# Patient Record
Sex: Female | Born: 1965 | Race: Black or African American | Hispanic: No | Marital: Single | Smoking: Never smoker
Health system: Southern US, Community
[De-identification: ages and names within clinical notes are randomized; demographics above are authoritative.]

## PROBLEM LIST (undated history)

## (undated) DIAGNOSIS — F419 Anxiety disorder, unspecified: Secondary | ICD-10-CM

## (undated) DIAGNOSIS — Z8719 Personal history of other diseases of the digestive system: Secondary | ICD-10-CM

## (undated) DIAGNOSIS — G4700 Insomnia, unspecified: Secondary | ICD-10-CM

## (undated) DIAGNOSIS — K219 Gastro-esophageal reflux disease without esophagitis: Secondary | ICD-10-CM

## (undated) DIAGNOSIS — Z973 Presence of spectacles and contact lenses: Secondary | ICD-10-CM

## (undated) DIAGNOSIS — J189 Pneumonia, unspecified organism: Secondary | ICD-10-CM

## (undated) DIAGNOSIS — D649 Anemia, unspecified: Secondary | ICD-10-CM

## (undated) DIAGNOSIS — R519 Headache, unspecified: Secondary | ICD-10-CM

## (undated) DIAGNOSIS — R011 Cardiac murmur, unspecified: Secondary | ICD-10-CM

## (undated) DIAGNOSIS — M1990 Unspecified osteoarthritis, unspecified site: Secondary | ICD-10-CM

## (undated) DIAGNOSIS — I10 Essential (primary) hypertension: Secondary | ICD-10-CM

## (undated) HISTORY — PX: ROUX-EN-Y GASTRIC BYPASS: SHX1104

## (undated) HISTORY — PX: APPENDECTOMY: SHX54

## (undated) HISTORY — PX: BUNIONECTOMY: SHX129

## (undated) HISTORY — PX: OTHER SURGICAL HISTORY: SHX169

## (undated) HISTORY — PX: FOOT SURGERY: SHX648

## (undated) HISTORY — PX: CHOLECYSTECTOMY: SHX55

## (undated) HISTORY — PX: HERNIA REPAIR: SHX51

---

## 2011-04-05 ENCOUNTER — Ambulatory Visit: Payer: Self-pay | Admitting: Family Medicine

## 2012-05-31 IMAGING — CR CERVICAL SPINE - COMPLETE 4+ VIEW
1 series · 8 of 8 positions shown · non-contrast
Comparison: none

REASON FOR EXAM: Dx Cervical neck strain Please Fax report [phone]
COMMENTS:

PROCEDURE:     DXR - DXR CERVICAL SPINE COMPLETE  - April 05, 2011 [DATE]
RESULT:     No acute soft tissue or bony abnormality is identified.
Degenerative changes are noted of the lower cervical spine.

[Series 1: w cervical spine lat · 0.14mm/px · 8 of 8 slices shown]
[im 1/8]
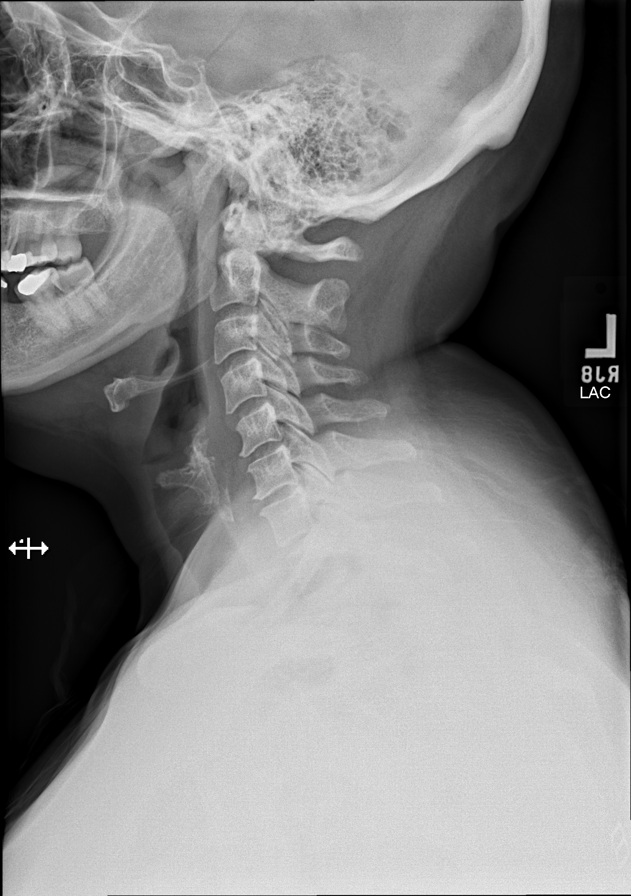
[im 2/8]
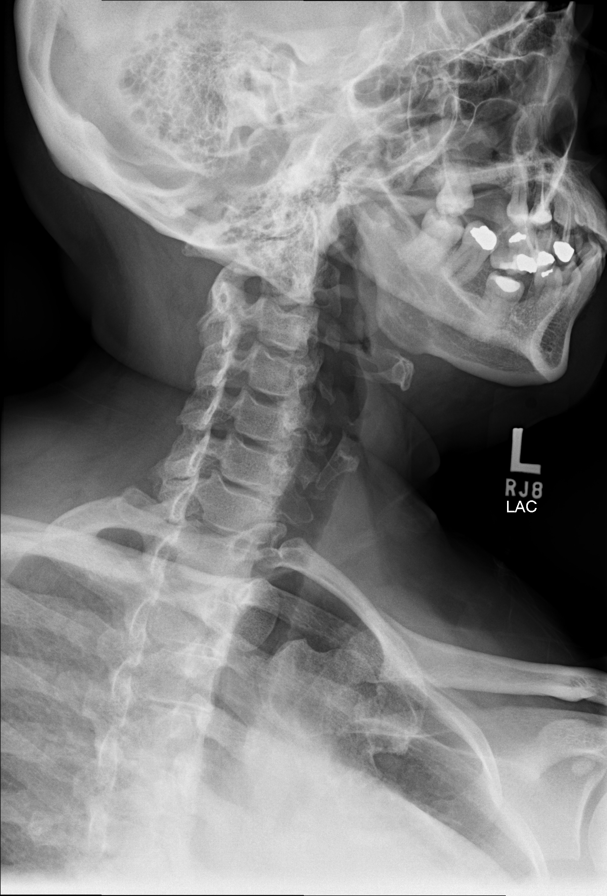
[im 3/8]
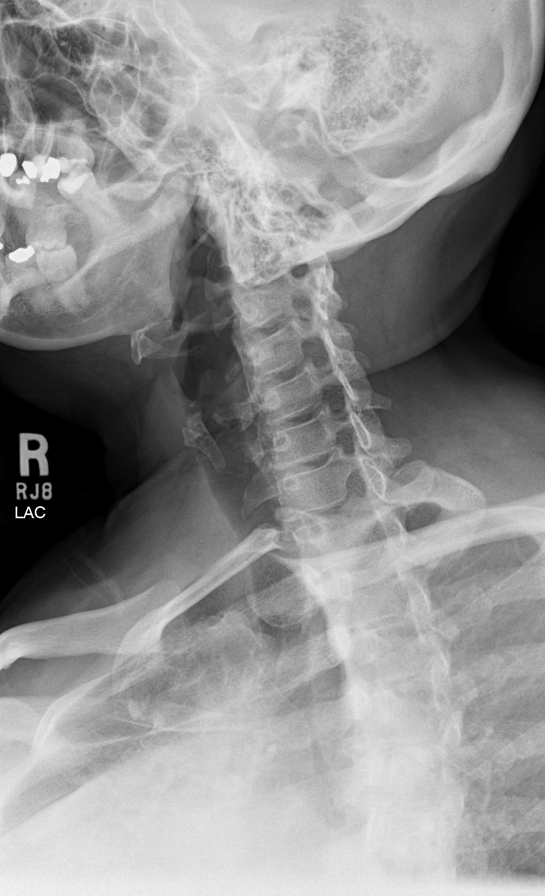
[im 4/8]
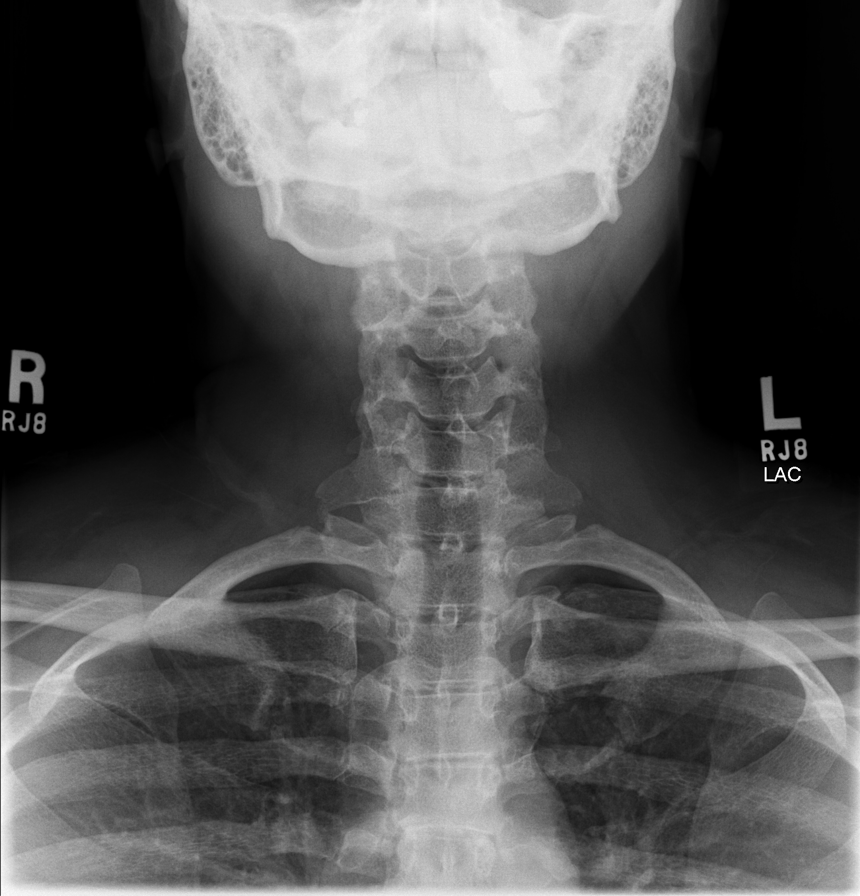
[im 5/8]
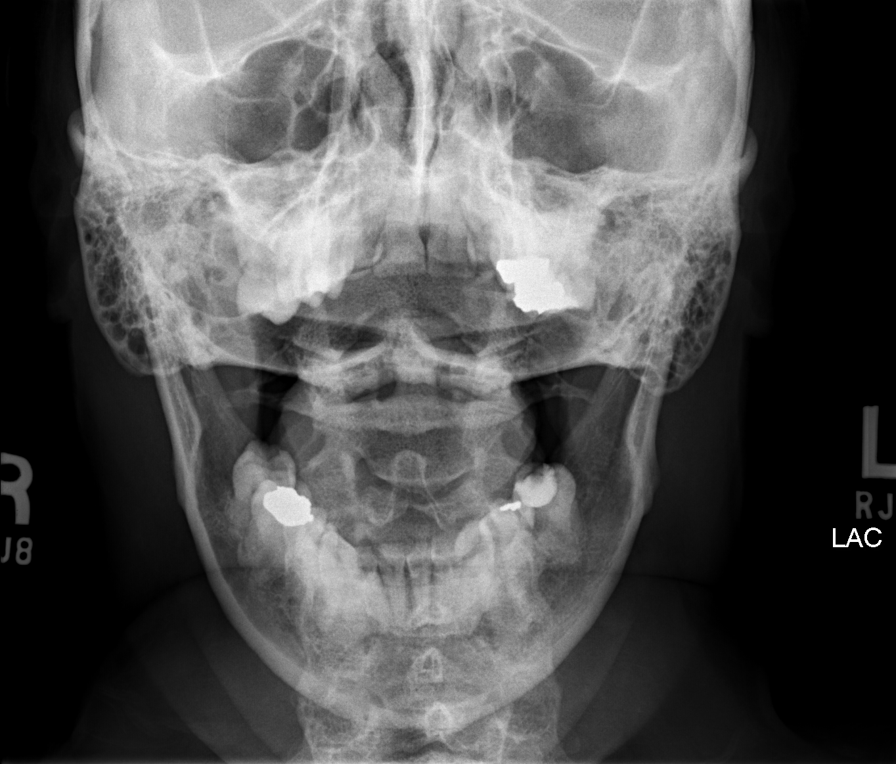
[im 6/8]
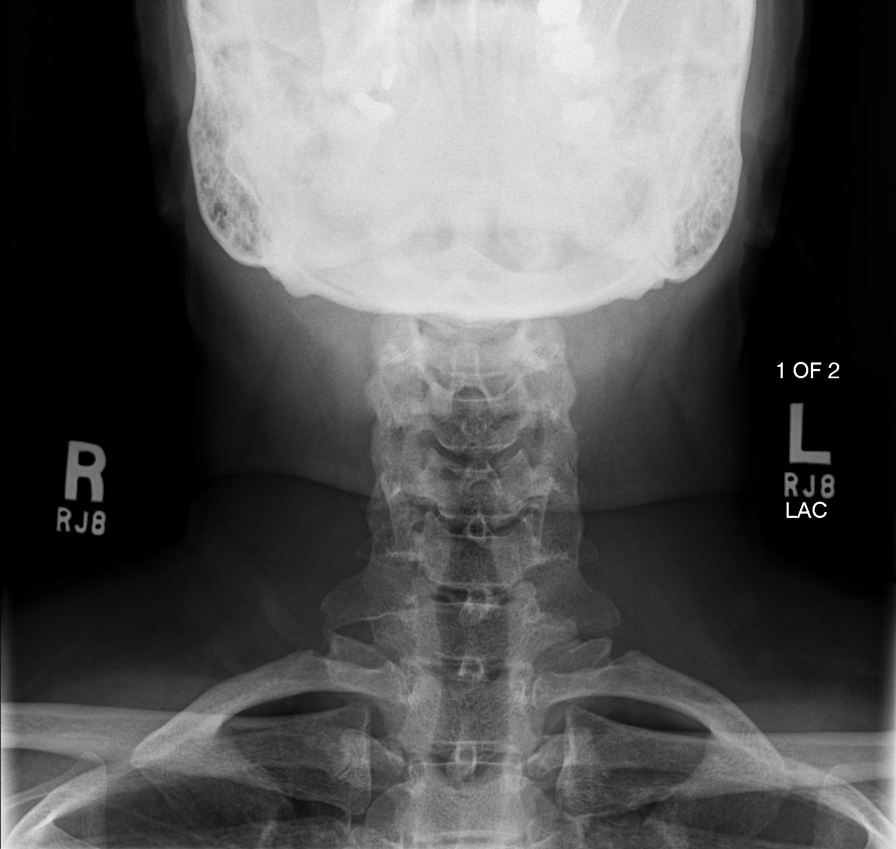
[im 7/8]
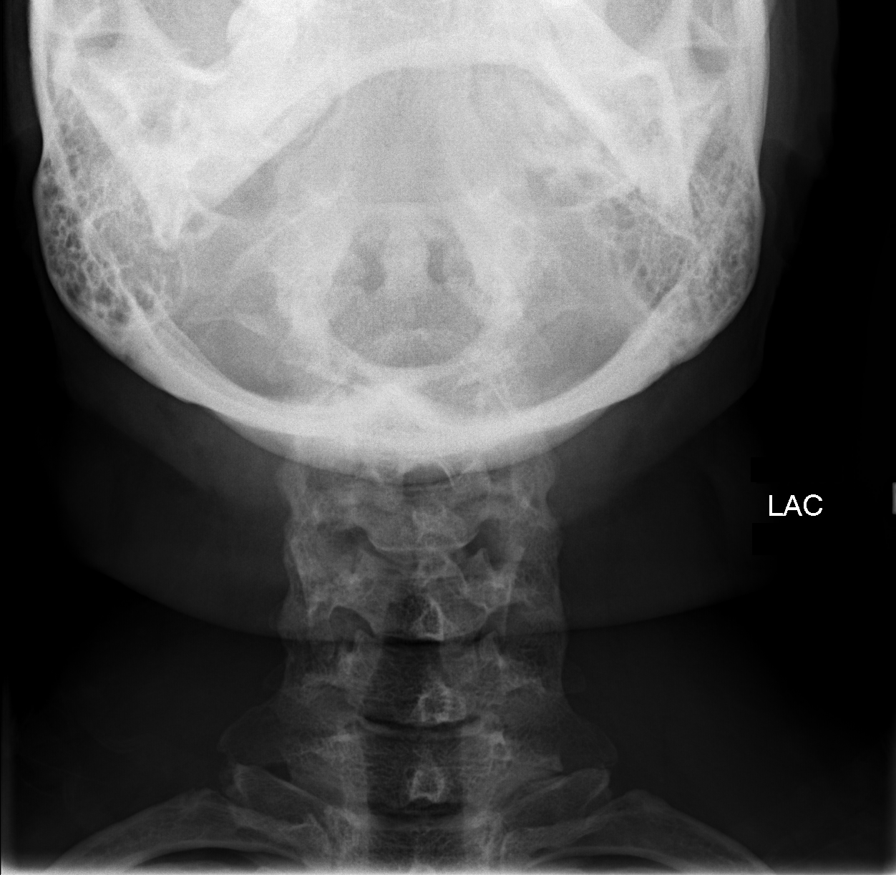
[im 8/8]
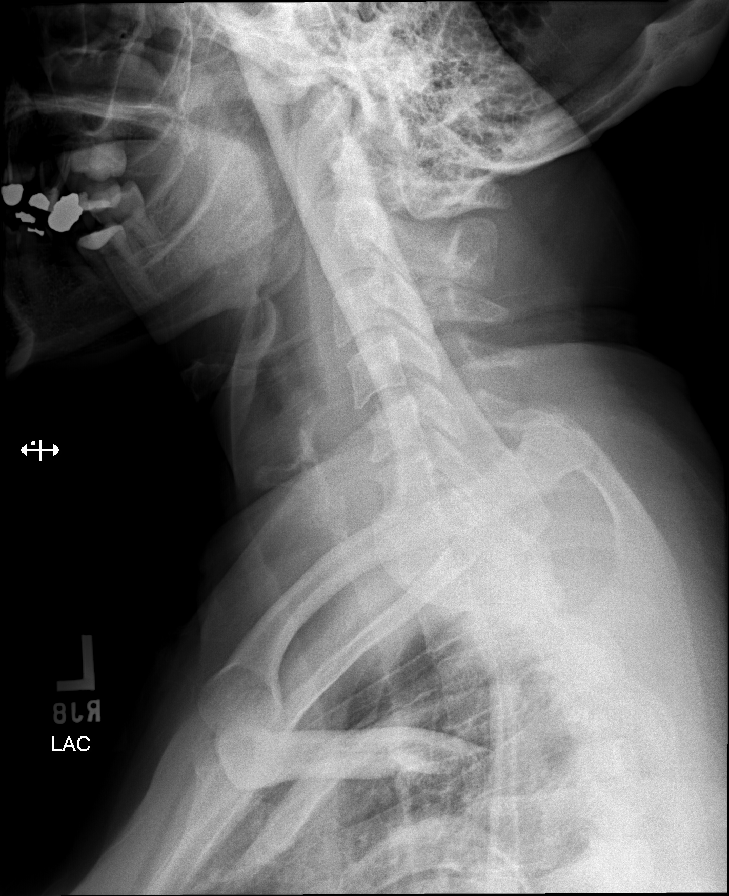

[8 of 8 positions shown; findings below may reference images not displayed]

IMPRESSION: No acute abnormality.

## 2015-02-01 DIAGNOSIS — Z8719 Personal history of other diseases of the digestive system: Secondary | ICD-10-CM | POA: Insufficient documentation

## 2015-02-01 DIAGNOSIS — I10 Essential (primary) hypertension: Secondary | ICD-10-CM | POA: Insufficient documentation

## 2015-02-01 DIAGNOSIS — E669 Obesity, unspecified: Secondary | ICD-10-CM | POA: Insufficient documentation

## 2015-12-27 DIAGNOSIS — F32A Depression, unspecified: Secondary | ICD-10-CM | POA: Insufficient documentation

## 2015-12-27 DIAGNOSIS — Z8639 Personal history of other endocrine, nutritional and metabolic disease: Secondary | ICD-10-CM | POA: Insufficient documentation

## 2018-07-09 ENCOUNTER — Ambulatory Visit (INDEPENDENT_AMBULATORY_CARE_PROVIDER_SITE_OTHER): Payer: No Typology Code available for payment source

## 2018-07-09 ENCOUNTER — Other Ambulatory Visit: Payer: Self-pay

## 2018-07-09 ENCOUNTER — Encounter: Payer: Self-pay | Admitting: Podiatry

## 2018-07-09 ENCOUNTER — Ambulatory Visit (INDEPENDENT_AMBULATORY_CARE_PROVIDER_SITE_OTHER): Payer: No Typology Code available for payment source | Admitting: Podiatry

## 2018-07-09 VITALS — BP 153/84 | HR 89

## 2018-07-09 DIAGNOSIS — M2011 Hallux valgus (acquired), right foot: Secondary | ICD-10-CM | POA: Diagnosis not present

## 2018-07-09 DIAGNOSIS — M2012 Hallux valgus (acquired), left foot: Secondary | ICD-10-CM

## 2018-07-09 DIAGNOSIS — M2042 Other hammer toe(s) (acquired), left foot: Secondary | ICD-10-CM

## 2018-07-09 DIAGNOSIS — M2010 Hallux valgus (acquired), unspecified foot: Secondary | ICD-10-CM

## 2018-07-09 DIAGNOSIS — M21611 Bunion of right foot: Secondary | ICD-10-CM

## 2018-07-09 DIAGNOSIS — M205X2 Other deformities of toe(s) (acquired), left foot: Secondary | ICD-10-CM

## 2018-07-09 NOTE — Patient Instructions (Signed)
Pre-Operative Instructions  Congratulations, you have decided to take an important step towards improving your quality of life.  You can be assured that the doctors and staff at [facility] will be with you every step of the way.  Here are some important things you should know:  1. Plan to be at the surgery center/hospital at least 1 (one) hour prior to your scheduled time, unless otherwise directed by the surgical center/hospital staff.  You must have a responsible adult accompany you, remain during the surgery and drive you home.  Make sure you have directions to the surgical center/hospital to ensure you arrive on time. 2. If you are having surgery at [facility] or [facility], you will need a copy of your medical history and physical form from your family physician within one month prior to the date of surgery. We will give you a form for your primary physician to complete.  3. We make every effort to accommodate the date you request for surgery.  However, there are times where surgery dates or times have to be moved.  We will contact you as soon as possible if a change in schedule is required.   4. No aspirin/ibuprofen for one week before surgery.  If you are on aspirin, any non-steroidal anti-inflammatory medications (Mobic, Aleve, Ibuprofen) should not be taken seven (7) days prior to your surgery.  You make take Tylenol for pain prior to surgery.  5. Medications - If you are taking daily heart and blood pressure medications, seizure, reflux, allergy, asthma, anxiety, pain or diabetes medications, make sure you notify the surgery center/hospital before the day of surgery so they can tell you which medications you should take or avoid the day of surgery. 6. No food or drink after midnight the night before surgery unless directed otherwise by surgical center/hospital staff. 7. No alcoholic beverages 24-hours prior to surgery.  No smoking 24-hours prior or 24-hours after  surgery. 8. Wear loose pants or shorts. They should be loose enough to fit over bandages, boots, and casts. 9. Don't wear slip-on shoes. Sneakers are preferred. 10. Bring your boot with you to the surgery center/hospital.  Also bring crutches or a walker if your physician has prescribed it for you.  If you do not have this equipment, it will be provided for you after surgery. 11. If you have not been contacted by the surgery center/hospital by the day before your surgery, call to confirm the date and time of your surgery. 12. Leave-time from work may vary depending on the type of surgery you have.  Appropriate arrangements should be made prior to surgery with your employer. 13. Prescriptions will be provided immediately following surgery by your doctor.  Fill these as soon as possible after surgery and take the medication as directed. Pain medications will not be refilled on weekends and must be approved by the doctor. 14. Remove nail polish on the operative foot and avoid getting pedicures prior to surgery. 15. Wash the night before surgery.  The night before surgery wash the foot and leg well with water and the antibacterial soap provided. Be sure to pay special attention to beneath the toenails and in between the toes.  Wash for at least three (3) minutes. Rinse thoroughly with water and dry well with a towel.  Perform this wash unless told not to do so by your physician.  Enclosed: 1 Ice pack (please put in freezer the night before surgery)   1 Hibiclens skin cleaner     Pre-op instructions  If you have any questions regarding the instructions, please do not hesitate to call our office.  [place]: [address], [place], [place] [address] -- [phone]  [place]: [address] [place], [place] [address] -- [phone]  [place]: [address]  [place], [place] [address] -- [phone]  [place]: [address], [address], [place], [place] [address] -- [phone]  Website:  [email or web address] 

## 2018-07-09 NOTE — Progress Notes (Signed)
   HPI: 52-year-old female presents the office today as a new patient in regards to hammertoes to the second third digits of the left foot.  Patient had surgery in November 2019 on her left foot including bunionectomy with hammertoe repair 2, 3.  [facility]  She then had surgery in December 2019 on her right foot which only included bunionectomy. Patient developed complications for her left foot and she became frustrated.  She requested a second opinion she was referred here for a second opinion and treatment options.  History reviewed. No pertinent past medical history.   Physical Exam: General: The patient is alert and oriented x3 in no acute distress.  Dermatology: Skin is warm, dry and supple bilateral lower extremities. Negative for open lesions or macerations.  Skin incisions made over the left forefoot are hypertrophic but otherwise healed.  Vascular: Palpable pedal pulses bilaterally. No edema or erythema noted. Capillary refill within normal limits.  Neurological: Epicritic and protective threshold grossly intact bilaterally.   Musculoskeletal Exam: Range of motion within normal limits to all pedal and ankle joints bilateral. Muscle strength 5/5 in all groups bilateral.  Rigid hammertoe contracture noted to the second and third digits of the left foot.  Limited range of motion noted.  Radiographic Exam:  Normal osseous mineralization.  Malalignment of the left second digit phalanges noted.  Joint destruction noted to the first MTPJ left foot.  No orthopedic hardware noted to the left foot. Right foot bunionectomy evident on radiographic exam with intact orthopedic screw x1 in the head of the first metatarsal.  Assessment: 1. H/o bunionectomy with hammertoe repair 2, 3 left - Nov 2019 2.  Malalignment/hammertoe recurrent 2, 3 left 3.  Joint destruction first MTPJ left 4. H/o bunionectomy right - Dec 2019   Plan of Care:  1. Patient evaluated. X-Rays reviewed.   2.  Today I recommend the patient revisional surgery to correct the left forefoot.  I explained all different treatment options and she would like to pursue surgical correction.  All possible complications and details the procedure were explained.  No guarantees were expressed or implied.  All patient questions answered. 3.  Authorization for surgery initiated today.  Surgery will consist of first MTPJ arthroplasty with Silastic implant left.  Hammertoe repair digits 2, 3 left with internal screw fixation.  MTPJ capsulotomy with percutaneous pin fixation 2, 3 left. 4.  Return to clinic 1 week postop       M. , DPM [facility]  Dr.  M. , DPM    [address]                                        [place], [place] [address]                Office [phone]  Fax [phone]      

## 2018-07-09 NOTE — H&P (View-Only) (Signed)
   HPI: 53 year old female presents the office today as a new patient in regards to hammertoes to the second third digits of the left foot.  Patient had surgery in November 2019 on her left foot including bunionectomy with hammertoe repair 2, 3.  Surgery performed in the [place].  She then had surgery in December 2019 on her right foot which only included bunionectomy. Patient developed complications for her left foot and she became frustrated.  She requested a second opinion she was referred here for a second opinion and treatment options.  History reviewed. No pertinent past medical history.   Physical Exam: General: The patient is alert and oriented x3 in no acute distress.  Dermatology: Skin is warm, dry and supple bilateral lower extremities. Negative for open lesions or macerations.  Skin incisions made over the left forefoot are hypertrophic but otherwise healed.  Vascular: Palpable pedal pulses bilaterally. No edema or erythema noted. Capillary refill within normal limits.  Neurological: Epicritic and protective threshold grossly intact bilaterally.   Musculoskeletal Exam: Range of motion within normal limits to all pedal and ankle joints bilateral. Muscle strength 5/5 in all groups bilateral.  Rigid hammertoe contracture noted to the second and third digits of the left foot.  Limited range of motion noted.  Radiographic Exam:  Normal osseous mineralization.  Malalignment of the left second digit phalanges noted.  Joint destruction noted to the first MTPJ left foot.  No orthopedic hardware noted to the left foot. Right foot bunionectomy evident on radiographic exam with intact orthopedic screw x1 in the head of the first metatarsal.  Assessment: 1. H/o bunionectomy with hammertoe repair 2, 3 left - Nov 2019 2.  Malalignment/hammertoe recurrent 2, 3 left 3.  Joint destruction first MTPJ left 4. H/o bunionectomy right - Dec 2019   Plan of Care:  1. Patient evaluated. X-Rays reviewed.   2.  Today I recommend the patient revisional surgery to correct the left forefoot.  I explained all different treatment options and she would like to pursue surgical correction.  All possible complications and details the procedure were explained.  No guarantees were expressed or implied.  All patient questions answered. 3.  Authorization for surgery initiated today.  Surgery will consist of first MTPJ arthroplasty with Silastic implant left.  Hammertoe repair digits 2, 3 left with internal screw fixation.  MTPJ capsulotomy with percutaneous pin fixation 2, 3 left. 4.  Return to clinic 1 week postop      Felecia Shelling, DPM [facility]  Dr. Felecia Shelling, DPM    [address] [place], [place] [address]                Office [phone]  Fax [phone]

## 2018-07-10 ENCOUNTER — Telehealth: Payer: Self-pay | Admitting: *Deleted

## 2018-07-10 NOTE — Telephone Encounter (Addendum)
"  I was in the [place] office yesterday, May 5.  I'm calling to schedule a Thursday surgery with Dr. Logan Bores.  Can you give me an idea when your first available date will be?

## 2018-07-11 NOTE — Telephone Encounter (Signed)
"  I need to schedule my surgery with Dr. Logan Bores.  I saw him in your [place] office and they told me to call you to schedule my surgery."  Dr. Logan Bores does surgeries on Thursdays.  Do you have a date in mind that you like?  "I'll take his next available date.  He can do it on June 4, 18, or 25.  "Schedule me for June 4."  I'll get it scheduled.  Someone from the surgical center will give you a call a day or two prior to your surgery date and they will give you your arrival time.  You need to register online with the surgical center, instructions are in the brochure that we gave you in your bag.

## 2018-07-17 ENCOUNTER — Other Ambulatory Visit: Payer: Self-pay | Admitting: *Deleted

## 2018-07-17 DIAGNOSIS — M205X2 Other deformities of toe(s) (acquired), left foot: Secondary | ICD-10-CM

## 2018-07-17 DIAGNOSIS — M2042 Other hammer toe(s) (acquired), left foot: Secondary | ICD-10-CM

## 2018-07-17 NOTE — Progress Notes (Signed)
Order was entered for a knee scooter.  I contacted Georgia Lopes from [facility] to see if she can assist in getting the scooter to the patient.  Ms. Fiegl' surgery is scheduled for 08/08/2018.

## 2018-07-22 ENCOUNTER — Telehealth: Payer: Self-pay | Admitting: *Deleted

## 2018-07-22 NOTE — Telephone Encounter (Addendum)
I left the patient a message to call me back.  I need to reschedule her surgery appointment that's scheduled for August 08, 2018.  She has [facility] insurance and [facility] is not in network.  Her surgery has to be done at a [facility] surgical center.  She will need a history and physical form to be completed by her primary care physician.  "I am returning your call.  You said I need to reschedule my surgery to a different location?"  Yes, that is correct.  We need to reschedule your surgery because [facility] is not in-network with your [facility] insurance. Your surgery has to be done at a [facility] facility.  "I am a professional.  I know it's not your fault but shouldn't I have been informed of this when I scheduled my surgery?  I have made arrangements to be off.  Will I still be able to have it that date?"  Yes, you can still have it that date but, you must have a history and physical completed by your primary care doctor.  Have you had a physical done within the past 30 days?  "I just left the [place].  I wish I had known this before.  How can I go about getting this form?  Can you email it to me?"  Yes, I can email it to you."  Email it to [email or web address].  Will you send it today?" Yes, I'll send it today.  I emailed the history and physical form as well as the Covid-19 surgical testing instructions.

## 2018-07-25 ENCOUNTER — Telehealth: Payer: Self-pay | Admitting: *Deleted

## 2018-07-25 NOTE — Telephone Encounter (Signed)
"  I received a message that you needed to speak to me in regards to Jill Nunez."  Yes, she's scheduled for surgery I want to make sure she's covered for the surgery and I also sent a request for durable medical equipment.  "She is authorized to have the surgery.  I do not see anything in the system regarding any dme.  What will she need?"  She will need a knee scooter, surgical shoe, and an air fracture walker.  "Send me the order and I will make sure it get to the appropriate department."  I faxed the dme order to [facility] as he requested.

## 2018-08-02 ENCOUNTER — Telehealth: Payer: Self-pay | Admitting: Podiatry

## 2018-08-02 NOTE — Telephone Encounter (Signed)
I was calling to make sure the surgical folks have my pre-op physical that I had done on 07/24/2018 I think. Also, no one has contacted me about getting the Covid-19 test. Please call me back, thank you.

## 2018-08-05 NOTE — Telephone Encounter (Signed)
I am returning your call.  "I just getting ready to call you.  I just want to know if you received my pre-op physical."  Yes, we did receive it.  "Great, that's all I needed to know.  Did you have anything you wanted to tell me?"  I do not, I just saw your message.  I was not in the office last week, so, I'm am returning calls.

## 2018-08-07 ENCOUNTER — Other Ambulatory Visit: Payer: Self-pay

## 2018-08-07 ENCOUNTER — Encounter (HOSPITAL_COMMUNITY): Payer: Self-pay | Admitting: *Deleted

## 2018-08-07 ENCOUNTER — Telehealth: Payer: Self-pay | Admitting: *Deleted

## 2018-08-07 NOTE — Telephone Encounter (Signed)
"  Can you fax over the history and physical form for Jill Nunez.  She's scheduled with Dr. Logan Bores on tomorrow.  I see that she did have the H&P completed.  My fax number is [phone]."  I faxed the history and physical form to [facility].

## 2018-08-07 NOTE — Progress Notes (Signed)
Nurse lvm with Delydia, Podiatric Assistant, to fax H&P; awaiting response.

## 2018-08-07 NOTE — Progress Notes (Signed)
Pt denies SOB, chest pain, and being under the care of a cardiologist. Pt denies having a stress test and cardiac cath. Pt denies having an EKG and chest x ray within the last year. Pt made aware to stop taking Aspirin (unless otherwise advised by surgeon), vitamins, fish oil 5-HTP and herbal medications. Do not take any NSAIDs ie: Ibuprofen, Advil, Naproxen (Aleve), Motrin, BC and Goody Powder.  Pt denies that she and family members tested positive for COVID-19 ( Pt scheduled to be tested prior to procedure). Pt denies that she and members of her family experienced the following symptoms:  Cough yes/no: No Fever (>100.54F)  yes/no: No Runny nose yes/no: No Sore throat yes/no: No Difficulty breathing/shortness of breath  yes/no: No  Have you or a family member traveled in the last 14 days and where? yes/no: No  Pt reminded that hospital visitation restrictions are in effect and the importance of the restrictions.   Pt verbalized understanding of all pre-op instructions.

## 2018-08-08 ENCOUNTER — Other Ambulatory Visit: Payer: Self-pay

## 2018-08-08 ENCOUNTER — Other Ambulatory Visit (HOSPITAL_COMMUNITY)
Admission: RE | Admit: 2018-08-08 | Discharge: 2018-08-08 | Disposition: A | Discharge status: Home / Self Care | Payer: No Typology Code available for payment source | Source: Ambulatory Visit | Attending: Podiatry | Admitting: Podiatry

## 2018-08-08 ENCOUNTER — Encounter (HOSPITAL_COMMUNITY): Payer: Self-pay | Admitting: *Deleted

## 2018-08-08 ENCOUNTER — Encounter (HOSPITAL_COMMUNITY)
Admission: RE | Disposition: A | Discharge status: Home / Self Care | Payer: Self-pay | Source: Home / Self Care | Attending: Podiatry

## 2018-08-08 ENCOUNTER — Ambulatory Visit (HOSPITAL_COMMUNITY): Payer: No Typology Code available for payment source | Admitting: Anesthesiology

## 2018-08-08 ENCOUNTER — Ambulatory Visit (HOSPITAL_COMMUNITY)
Admission: RE | Admit: 2018-08-08 | Discharge: 2018-08-08 | Disposition: A | Discharge status: Home / Self Care | Payer: No Typology Code available for payment source | Attending: Podiatry | Admitting: Podiatry

## 2018-08-08 DIAGNOSIS — M7752 Other enthesopathy of left foot: Secondary | ICD-10-CM | POA: Diagnosis not present

## 2018-08-08 DIAGNOSIS — M2012 Hallux valgus (acquired), left foot: Secondary | ICD-10-CM | POA: Diagnosis not present

## 2018-08-08 DIAGNOSIS — M2042 Other hammer toe(s) (acquired), left foot: Secondary | ICD-10-CM | POA: Diagnosis not present

## 2018-08-08 DIAGNOSIS — I10 Essential (primary) hypertension: Secondary | ICD-10-CM | POA: Diagnosis not present

## 2018-08-08 DIAGNOSIS — M205X2 Other deformities of toe(s) (acquired), left foot: Secondary | ICD-10-CM | POA: Diagnosis not present

## 2018-08-08 DIAGNOSIS — Z1159 Encounter for screening for other viral diseases: Secondary | ICD-10-CM | POA: Diagnosis not present

## 2018-08-08 DIAGNOSIS — M19072 Primary osteoarthritis, left ankle and foot: Secondary | ICD-10-CM | POA: Insufficient documentation

## 2018-08-08 HISTORY — DX: Presence of spectacles and contact lenses: Z97.3

## 2018-08-08 HISTORY — DX: Pneumonia, unspecified organism: J18.9

## 2018-08-08 HISTORY — DX: Unspecified osteoarthritis, unspecified site: M19.90

## 2018-08-08 HISTORY — DX: Anxiety disorder, unspecified: F41.9

## 2018-08-08 HISTORY — DX: Anemia, unspecified: D64.9

## 2018-08-08 HISTORY — DX: Headache, unspecified: R51.9

## 2018-08-08 HISTORY — DX: Essential (primary) hypertension: I10

## 2018-08-08 HISTORY — DX: Cardiac murmur, unspecified: R01.1

## 2018-08-08 HISTORY — DX: Gastro-esophageal reflux disease without esophagitis: K21.9

## 2018-08-08 HISTORY — DX: Personal history of other diseases of the digestive system: Z87.19

## 2018-08-08 HISTORY — DX: Insomnia, unspecified: G47.00

## 2018-08-08 HISTORY — PX: BUNIONECTOMY: SHX129

## 2018-08-08 HISTORY — PX: HAMMER TOE SURGERY: SHX385

## 2018-08-08 HISTORY — PX: CAPSULOTOMY: SHX379

## 2018-08-08 HISTORY — PX: IMPLANT OF A SILASTIC METATARSAL PHALANGE JOINT: SHX6452

## 2018-08-08 LAB — BASIC METABOLIC PANEL
Anion gap: 8 (ref 5–15)
BUN: 5 mg/dL — ABNORMAL LOW (ref 6–20)
CO2: 25 mmol/L (ref 22–32)
Calcium: 9 mg/dL (ref 8.9–10.3)
Chloride: 107 mmol/L (ref 98–111)
Creatinine, Ser: 0.59 mg/dL (ref 0.44–1.00)
GFR calc Af Amer: >60 mL/min (ref >60)
GFR calc non Af Amer: >60 mL/min (ref >60)
Glucose, Bld: 81 mg/dL (ref 70–99)
Potassium: 3.7 mmol/L (ref 3.5–5.1)
Sodium: 140 mmol/L (ref 135–145)

## 2018-08-08 LAB — CBC
HCT: 36.2 % (ref 36.0–46.0)
Hemoglobin: 11.7 g/dL — ABNORMAL LOW (ref 12.0–15.0)
MCH: 27.5 pg (ref 26.0–34.0)
MCHC: 32.3 g/dL (ref 30.0–36.0)
MCV: 85.2 fL (ref 80.0–100.0)
Platelets: 289 10*3/uL (ref 150–400)
RBC: 4.25 MIL/uL (ref 3.87–5.11)
RDW: 13.6 % (ref 11.5–15.5)
WBC: 4.9 10*3/uL (ref 4.0–10.5)
nRBC: 0 % (ref 0.0–0.2)

## 2018-08-08 LAB — SARS CORONAVIRUS 2 BY RT PCR (HOSPITAL ORDER, PERFORMED IN ~~LOC~~ HOSPITAL LAB): SARS Coronavirus 2: NEGATIVE

## 2018-08-08 LAB — POCT PREGNANCY, URINE: Preg Test, Ur: NEGATIVE

## 2018-08-08 SURGERY — BUNIONECTOMY
Anesthesia: General | Site: Foot | Laterality: Left

## 2018-08-08 MED ORDER — ONDANSETRON HCL 4 MG/2ML IJ SOLN
INTRAMUSCULAR | Status: DC | PRN
  Administered 2018-08-08: 4 mg via INTRAVENOUS

## 2018-08-08 MED ORDER — FENTANYL CITRATE (PF) 250 MCG/5ML IJ SOLN
INTRAMUSCULAR | Status: AC
  Filled 2018-08-08: qty 5

## 2018-08-08 MED ORDER — LIDOCAINE 2% (20 MG/ML) 5 ML SYRINGE
INTRAMUSCULAR | Status: DC | PRN
  Administered 2018-08-08: 50 mg via INTRAVENOUS

## 2018-08-08 MED ORDER — OXYCODONE-ACETAMINOPHEN 5-325 MG PO TABS
ORAL_TABLET | ORAL | Status: AC
  Filled 2018-08-08: qty 1

## 2018-08-08 MED ORDER — ACETAMINOPHEN 160 MG/5ML PO SOLN: 325.0000 mg | Freq: Once | ORAL | Status: DC | PRN

## 2018-08-08 MED ORDER — OXYCODONE-ACETAMINOPHEN 5-325 MG PO TABS
1.0000 | ORAL_TABLET | Freq: Once | ORAL | Status: AC
  Administered 2018-08-08: 1 via ORAL

## 2018-08-08 MED ORDER — SODIUM CHLORIDE 0.9 % IR SOLN
Status: DC | PRN
  Administered 2018-08-08: 1000 mL

## 2018-08-08 MED ORDER — SCOPOLAMINE 1 MG/3DAYS TD PT72
MEDICATED_PATCH | TRANSDERMAL | Status: AC
  Filled 2018-08-08: qty 1

## 2018-08-08 MED ORDER — PROMETHAZINE HCL 25 MG/ML IJ SOLN: 6.2500 mg | INTRAMUSCULAR | Status: DC | PRN

## 2018-08-08 MED ORDER — CHLORHEXIDINE GLUCONATE 4 % EX LIQD: 60.0000 mL | Freq: Once | CUTANEOUS | Status: DC

## 2018-08-08 MED ORDER — HYDROMORPHONE HCL 1 MG/ML IJ SOLN
0.2500 mg | INTRAMUSCULAR | Status: DC | PRN
  Administered 2018-08-08 (×2): 0.5 mg via INTRAVENOUS

## 2018-08-08 MED ORDER — MIDAZOLAM HCL 5 MG/5ML IJ SOLN
INTRAMUSCULAR | Status: DC | PRN
  Administered 2018-08-08: 2 mg via INTRAVENOUS

## 2018-08-08 MED ORDER — ACETAMINOPHEN 325 MG PO TABS: 325.0000 mg | ORAL_TABLET | Freq: Once | ORAL | Status: DC | PRN

## 2018-08-08 MED ORDER — LACTATED RINGERS IV SOLN: INTRAVENOUS | Status: DC

## 2018-08-08 MED ORDER — BUPIVACAINE HCL (PF) 0.5 % IJ SOLN
INTRAMUSCULAR | Status: AC
  Filled 2018-08-08: qty 30

## 2018-08-08 MED ORDER — BUPIVACAINE HCL (PF) 0.5 % IJ SOLN
INTRAMUSCULAR | Status: DC | PRN
  Administered 2018-08-08: 20 mL

## 2018-08-08 MED ORDER — LACTATED RINGERS IV SOLN
INTRAVENOUS | Status: DC
  Administered 2018-08-08: 14:00:00 via INTRAVENOUS

## 2018-08-08 MED ORDER — DEXAMETHASONE SODIUM PHOSPHATE 10 MG/ML IJ SOLN
INTRAMUSCULAR | Status: AC
  Filled 2018-08-08: qty 1

## 2018-08-08 MED ORDER — DEXAMETHASONE SODIUM PHOSPHATE 4 MG/ML IJ SOLN
INTRAMUSCULAR | Status: DC | PRN
  Administered 2018-08-08: 4 mg via INTRAVENOUS

## 2018-08-08 MED ORDER — HYDROMORPHONE HCL 1 MG/ML IJ SOLN
INTRAMUSCULAR | Status: AC
  Filled 2018-08-08: qty 1

## 2018-08-08 MED ORDER — MIDAZOLAM HCL 2 MG/2ML IJ SOLN
INTRAMUSCULAR | Status: AC
  Filled 2018-08-08: qty 2

## 2018-08-08 MED ORDER — ONDANSETRON HCL 4 MG/2ML IJ SOLN
INTRAMUSCULAR | Status: AC
  Filled 2018-08-08: qty 2

## 2018-08-08 MED ORDER — PROPOFOL 10 MG/ML IV BOLUS
INTRAVENOUS | Status: DC | PRN
  Administered 2018-08-08: 200 mg via INTRAVENOUS

## 2018-08-08 MED ORDER — ACETAMINOPHEN 10 MG/ML IV SOLN
INTRAVENOUS | Status: AC
  Administered 2018-08-08: 1000 mg
  Filled 2018-08-08: qty 100

## 2018-08-08 MED ORDER — ACETAMINOPHEN 10 MG/ML IV SOLN: 1000.0000 mg | Freq: Once | INTRAVENOUS | Status: DC | PRN

## 2018-08-08 MED ORDER — MEPERIDINE HCL 25 MG/ML IJ SOLN: 6.2500 mg | INTRAMUSCULAR | Status: DC | PRN

## 2018-08-08 MED ORDER — FENTANYL CITRATE (PF) 100 MCG/2ML IJ SOLN
INTRAMUSCULAR | Status: DC | PRN
  Administered 2018-08-08 (×5): 50 ug via INTRAVENOUS

## 2018-08-08 MED ORDER — CEFAZOLIN SODIUM-DEXTROSE 2-4 GM/100ML-% IV SOLN
2.0000 g | INTRAVENOUS | Status: AC
  Administered 2018-08-08: 2 g via INTRAVENOUS

## 2018-08-08 MED ORDER — LIDOCAINE HCL 2 % IJ SOLN
INTRAMUSCULAR | Status: AC
  Filled 2018-08-08: qty 20

## 2018-08-08 MED ORDER — CEFAZOLIN SODIUM-DEXTROSE 2-4 GM/100ML-% IV SOLN
INTRAVENOUS | Status: AC
  Filled 2018-08-08: qty 100

## 2018-08-08 SURGICAL SUPPLY — 61 items
BANDAGE ACE 4X5 VEL STRL LF (GAUZE/BANDAGES/DRESSINGS) ×6 IMPLANT
BENZOIN TINCTURE PRP APPL 2/3 (GAUZE/BANDAGES/DRESSINGS) ×3 IMPLANT
BIT DRILL 100X2XQC STRL (BIT) ×1 IMPLANT
BIT DRILL QC 2.0X100 (BIT) ×2
BIT DRL 100X2XQC STRL (BIT) ×1
BLADE AVERAGE 25MMX9MM (BLADE) ×1
BLADE AVERAGE 25X9 (BLADE) ×2 IMPLANT
BLADE SURG 15 STRL LF DISP TIS (BLADE) ×2 IMPLANT
BLADE SURG 15 STRL SS (BLADE) ×4
BNDG CONFORM 2 STRL LF (GAUZE/BANDAGES/DRESSINGS) ×3 IMPLANT
BNDG ESMARK 4X9 LF (GAUZE/BANDAGES/DRESSINGS) ×3 IMPLANT
BNDG GAUZE ELAST 4 BULKY (GAUZE/BANDAGES/DRESSINGS) ×3 IMPLANT
BUR SPIRAL ROUTER 2.3 (BUR) ×2 IMPLANT
BUR SPIRAL ROUTER 2.3MM (BUR) ×1
CHLORAPREP W/TINT 10.5 ML (MISCELLANEOUS) ×3 IMPLANT
CHLORAPREP W/TINT 26 (MISCELLANEOUS) ×3 IMPLANT
CLOSURE WOUND 1/4X4 (GAUZE/BANDAGES/DRESSINGS) ×1
COVER WAND RF STERILE (DRAPES) ×3 IMPLANT
CUFF TOURNIQUET SINGLE 18IN (TOURNIQUET CUFF) ×3 IMPLANT
CUFF TOURNIQUET SINGLE 24IN (TOURNIQUET CUFF) IMPLANT
DRAPE EXTREMITY T 121X128X90 (DISPOSABLE) ×3 IMPLANT
DRAPE OEC MINIVIEW 54X84 (DRAPES) ×3 IMPLANT
DRAPE U-SHAPE 47X51 STRL (DRAPES) ×3 IMPLANT
DRSG XEROFORM 1X8 (GAUZE/BANDAGES/DRESSINGS) ×3 IMPLANT
ELECT REM PT RETURN 9FT ADLT (ELECTROSURGICAL) ×3
ELECTRODE REM PT RTRN 9FT ADLT (ELECTROSURGICAL) ×1 IMPLANT
GAUZE SPONGE 4X4 12PLY STRL (GAUZE/BANDAGES/DRESSINGS) ×3 IMPLANT
GAUZE XEROFORM 1X8 LF (GAUZE/BANDAGES/DRESSINGS) ×3 IMPLANT
GLOVE BIO SURGEON STRL SZ8 (GLOVE) ×3 IMPLANT
GLOVE BIOGEL PI IND STRL 8 (GLOVE) ×1 IMPLANT
GLOVE BIOGEL PI INDICATOR 8 (GLOVE) ×2
GOWN STRL REUS W/ TWL LRG LVL3 (GOWN DISPOSABLE) ×1 IMPLANT
GOWN STRL REUS W/ TWL XL LVL3 (GOWN DISPOSABLE) ×1 IMPLANT
GOWN STRL REUS W/TWL LRG LVL3 (GOWN DISPOSABLE) ×2
GOWN STRL REUS W/TWL XL LVL3 (GOWN DISPOSABLE) ×2
GUIDE WIRE SMOOTH .82X90 (WIRE) ×3
GUIDEWIRE SMOOTH .82X90 (WIRE) ×1 IMPLANT
IMPL FINGER JT FGT SZ 40T (Joint) ×1 IMPLANT
IMPLANT FINGER JT FGT SZ 40T (Joint) ×3 IMPLANT
K-WIRE .045 CH (WIRE) ×6
KIT BASIN OR (CUSTOM PROCEDURE TRAY) ×3 IMPLANT
KWIRE .045 CH (WIRE) ×2 IMPLANT
NDL SAFETY ECLIPSE 18X1.5 (NEEDLE) IMPLANT
NEEDLE HYPO 18GX1.5 SHARP (NEEDLE)
NEEDLE HYPO 25X1 1.5 SAFETY (NEEDLE) ×3 IMPLANT
NS IRRIG 1000ML POUR BTL (IV SOLUTION) ×3 IMPLANT
OVER DRILL (DRILL) ×3 IMPLANT
PACK ORTHO EXTREMITY (CUSTOM PROCEDURE TRAY) ×3 IMPLANT
PADDING CAST ABS 4INX4YD NS (CAST SUPPLIES) ×4
PADDING CAST ABS COTTON 4X4 ST (CAST SUPPLIES) ×2 IMPLANT
SCRUB BETADINE 4OZ XXX (MISCELLANEOUS) IMPLANT
SPLINT FIBERGLASS 4X30 (CAST SUPPLIES) ×3 IMPLANT
STAPLER VISISTAT (STAPLE) IMPLANT
STRIP CLOSURE SKIN 1/4X4 (GAUZE/BANDAGES/DRESSINGS) ×2 IMPLANT
SUT MON AB 5-0 PS2 18 (SUTURE) ×3 IMPLANT
SUT PROLENE 4 0 PS 2 18 (SUTURE) ×3 IMPLANT
SUT VICRYL 4-0 PS2 18IN ABS (SUTURE) ×6 IMPLANT
SYR BULB 3OZ (MISCELLANEOUS) IMPLANT
SYR BULB IRRIGATION 50ML (SYRINGE) ×3 IMPLANT
SYR CONTROL 10ML LL (SYRINGE) ×3 IMPLANT
UNDERPAD 30X30 (UNDERPADS AND DIAPERS) ×3 IMPLANT

## 2018-08-08 NOTE — Interval H&P Note (Signed)
History and Physical Interval Note:  08/08/2018 1:42 PM  Jill Nunez  has presented today for surgery, with the diagnosis of HAMMER TOE AND HALLUX RIGIDIS.  The various methods of treatment have been discussed with the patient and family. After consideration of risks, benefits and other options for treatment, the patient has consented to  Procedure(s): BUNIONECTOMY (Left) HAMMER TOE CORRECTION SECOND AND THIRD LEFT (Left) CAPSULOTOMY SECOND AND THIRD LEFT (Left) IMPLANT OF A SILASTIC METATARSAL PHALANGE JOINT (Left) as a surgical intervention.  The patient's history has been reviewed, patient examined, no change in status, stable for surgery.  I have reviewed the patient's chart and labs.  Questions were answered to the patient's satisfaction.     Felecia Shelling

## 2018-08-08 NOTE — Brief Op Note (Signed)
08/08/2018  7:44 PM  PATIENT:  Jill Nunez  53 y.o. female  PRE-OPERATIVE DIAGNOSIS:  HAMMER TOE AND HALLUX RIGIDIS  POST-OPERATIVE DIAGNOSIS:  HAMMER TOE AND HALLUX RIGIDIS  PROCEDURE:  Procedure(s): BUNIONECTOMY (Left) HAMMER TOE CORRECTION SECOND AND THIRD LEFT (Left) CAPSULOTOMY SECOND AND THIRD LEFT (Left) IMPLANT OF A SILASTIC METATARSAL PHALANGE JOINT (Left)  SURGEON:  Surgeon(s) and Role:    Felecia Shelling, DPM - Primary  PHYSICIAN ASSISTANT:   ASSISTANTS: none   ANESTHESIA:   local  EBL:  5 mL   BLOOD ADMINISTERED:none  DRAINS: none   LOCAL MEDICATIONS USED:  MARCAINE     SPECIMEN:  No Specimen  DISPOSITION OF SPECIMEN:  N/A  COUNTS:  YES  TOURNIQUET:   Total Tourniquet Time Documented: Calf (Left) - 87 minutes Total: Calf (Left) - 87 minutes   DICTATION: .Reubin Milan Dictation  PLAN OF CARE: Discharge to home after PACU  PATIENT DISPOSITION:  PACU - hemodynamically stable.   Delay start of Pharmacological VTE agent (>24hrs) due to surgical blood loss or risk of bleeding: not applicable  Felecia Shelling, DPM [facility]  Dr. Felecia Shelling, DPM    [address] [place], [place] [address]                Office [phone]  Fax [phone]

## 2018-08-08 NOTE — Anesthesia Preprocedure Evaluation (Addendum)
Anesthesia Evaluation  Patient identified by MRN, date of birth, ID band Patient awake    Reviewed: Allergy & Precautions, NPO status , Patient's Chart, lab work & pertinent test results  Airway Mallampati: I  TM Distance: >3 FB Neck ROM: Full    Dental  (+) Teeth Intact, Dental Advisory Given   Pulmonary    breath sounds clear to auscultation       Cardiovascular hypertension, Pt. on medications  Rhythm:Regular Rate:Normal     Neuro/Psych  Headaches, Anxiety    GI/Hepatic Neg liver ROS, hiatal hernia, GERD  ,  Endo/Other  negative endocrine ROS  Renal/GU negative Renal ROS     Musculoskeletal  (+) Arthritis , Osteoarthritis,    Abdominal Normal abdominal exam  (+)   Peds  Hematology   Anesthesia Other Findings   Reproductive/Obstetrics                            Anesthesia Physical Anesthesia Plan  ASA: II  Anesthesia Plan: General   Post-op Pain Management:    Induction: Intravenous  PONV Risk Score and Plan: 4 or greater and Ondansetron, Dexamethasone, Midazolam and Scopolamine patch - Pre-op  Airway Management Planned: LMA  Additional Equipment: None  Intra-op Plan:   Post-operative Plan: Extubation in OR  Informed Consent: I have reviewed the patients History and Physical, chart, labs and discussed the procedure including the risks, benefits and alternatives for the proposed anesthesia with the patient or authorized representative who has indicated his/her understanding and acceptance.     Dental advisory given  Plan Discussed with: CRNA  Anesthesia Plan Comments:        Anesthesia Quick Evaluation

## 2018-08-08 NOTE — Anesthesia Procedure Notes (Signed)
Procedure Name: LMA Insertion Date/Time: 08/08/2018 1:55 PM Performed by: Jodell Cipro, CRNA Pre-anesthesia Checklist: Patient identified, Emergency Drugs available, Suction available and Patient being monitored Patient Re-evaluated:Patient Re-evaluated prior to induction Oxygen Delivery Method: Circle System Utilized Preoxygenation: Pre-oxygenation with 100% oxygen Induction Type: IV induction Ventilation: Mask ventilation without difficulty LMA: LMA inserted LMA Size: 4.0 Number of attempts: 1 Airway Equipment and Method: Bite block Placement Confirmation: positive ETCO2 Tube secured with: Tape Dental Injury: Teeth and Oropharynx as per pre-operative assessment

## 2018-08-08 NOTE — Transfer of Care (Signed)
Immediate Anesthesia Transfer of Care Note  Patient: Jill Nunez  Procedure(s) Performed: BUNIONECTOMY (Left Foot) HAMMER TOE CORRECTION SECOND AND THIRD LEFT (Left Foot) CAPSULOTOMY SECOND AND THIRD LEFT (Left Foot) IMPLANT OF A SILASTIC METATARSAL PHALANGE JOINT (Left Foot)  Patient Location: PACU  Anesthesia Type:General  Level of Consciousness: awake, alert  and oriented    Airway & Oxygen Therapy: Patient Spontanous Breathing and Patient connected to nasal cannula oxygen  Post-op Assessment: Report given to RN and Post -op Vital signs reviewed and stable  Post vital signs: Reviewed and stable  Last Vitals:  Vitals Value Taken Time  BP 155/85 08/08/2018  3:55 PM  Temp    Pulse 91 08/08/2018  3:59 PM  Resp 18 08/08/2018  3:59 PM  SpO2 100 % 08/08/2018  3:59 PM  Vitals shown include unvalidated device data.  Last Pain:  Vitals:   08/08/18 1110  TempSrc:   PainSc: 0-No pain      Patients Stated Pain Goal: 3 (08/08/18 1110)  Complications: No apparent anesthesia complications

## 2018-08-09 ENCOUNTER — Encounter (HOSPITAL_COMMUNITY): Payer: Self-pay | Admitting: Podiatry

## 2018-08-09 NOTE — Anesthesia Postprocedure Evaluation (Signed)
Anesthesia Post Note  Patient: ANNIE ELLIN  Procedure(s) Performed: BUNIONECTOMY (Left Foot) HAMMER TOE CORRECTION SECOND AND THIRD LEFT (Left Foot) CAPSULOTOMY SECOND AND THIRD LEFT (Left Foot) IMPLANT OF A SILASTIC METATARSAL PHALANGE JOINT (Left Foot)     Patient location during evaluation: PACU Anesthesia Type: General Level of consciousness: awake and alert Pain management: pain level controlled Vital Signs Assessment: post-procedure vital signs reviewed and stable Respiratory status: spontaneous breathing, nonlabored ventilation, respiratory function stable and patient connected to nasal cannula oxygen Cardiovascular status: blood pressure returned to baseline and stable Postop Assessment: no apparent nausea or vomiting Anesthetic complications: no    Last Vitals:  Vitals:   08/08/18 1600 08/08/18 1615  BP:    Pulse: 86 90  Resp: 13 14  Temp:    SpO2: 99% 97%    Last Pain:  Vitals:   08/08/18 1553  TempSrc:   PainSc: 5    Pain Goal: Patients Stated Pain Goal: 3 (08/08/18 1110)                 Shelton Silvas

## 2018-08-14 ENCOUNTER — Ambulatory Visit (INDEPENDENT_AMBULATORY_CARE_PROVIDER_SITE_OTHER): Payer: No Typology Code available for payment source

## 2018-08-14 ENCOUNTER — Ambulatory Visit (INDEPENDENT_AMBULATORY_CARE_PROVIDER_SITE_OTHER): Payer: Self-pay | Admitting: Podiatry

## 2018-08-14 ENCOUNTER — Other Ambulatory Visit: Payer: Self-pay

## 2018-08-14 VITALS — BP 149/92 | HR 95 | Temp 97.8°F

## 2018-08-14 DIAGNOSIS — M205X2 Other deformities of toe(s) (acquired), left foot: Secondary | ICD-10-CM

## 2018-08-14 DIAGNOSIS — Z9889 Other specified postprocedural states: Secondary | ICD-10-CM | POA: Diagnosis not present

## 2018-08-14 NOTE — Progress Notes (Signed)
She presents today for her first postop visit date of surgery August 08, 2018 status post Jake Michaelis bunion implant capsulotomy second and third toes of the left foot with hammertoe repairs and K wire retention.  States that is still painful but the pain medication helps and it will have to take very many at this point.  She denies fever chills nausea vomiting muscle aches pains calf pain back pain chest pain shortness of breath.  Objective: Dry sterile dressing intact once removed demonstrates that thick area of dried blood within the dressing.  Otherwise the sutures are intact margins well coapted K wires are intact she has good range of motion of the first metatarsophalangeal joint passively and actively.  Minimal edema no erythema cellulitis drainage or odor.  Radiographs taken today confirm Keller implant and arthroplasties PIPJ second and third digits of the left foot with K wires retained through the metatarsal heads.  Assessment: Well-healing surgical foot left.  Plan: Redressed today dressed a compressive dressing will follow-up with Dr. Amalia Hailey in 1 week for possible suture removal.  I expressed to her the more than likely the K wires will be retained for quite some time.

## 2018-08-21 NOTE — Brief Op Note (Signed)
08/08/2018  7:32 AM  PATIENT:  Jill Nunez  53 y.o. female  PRE-OPERATIVE DIAGNOSIS:  HAMMER TOE AND HALLUX RIGIDIS  POST-OPERATIVE DIAGNOSIS:  HAMMER TOE AND HALLUX RIGIDIS  PROCEDURE:  Procedure(s): BUNIONECTOMY (Left) HAMMER TOE CORRECTION SECOND AND THIRD LEFT (Left) CAPSULOTOMY SECOND AND THIRD LEFT (Left) IMPLANT OF A SILASTIC METATARSAL PHALANGE JOINT (Left)  SURGEON:  Surgeon(s) and Role:    Edrick Kins, DPM - Primary  PHYSICIAN ASSISTANT:   ASSISTANTS: none   ANESTHESIA:   General Anesthesia  EBL:  5 mL   BLOOD ADMINISTERED:none  DRAINS: none   LOCAL MEDICATIONS USED:  MARCAINE  SPECIMEN:  No Specimen  DISPOSITION OF SPECIMEN:  N/A  COUNTS:  YES  TOURNIQUET:   Total Tourniquet Time Documented: Calf (Left) - 87 minutes Total: Calf (Left) - 87 minutes   DICTATION: .Viviann Spare Dictation  PLAN OF CARE: Discharge to home after PACU  PATIENT DISPOSITION:  PACU - hemodynamically stable.   Delay start of Pharmacological VTE agent (>24hrs) due to surgical blood loss or risk of bleeding: not applicable  Edrick Kins, DPM [facility]  Dr. Edrick Kins, DPM    [address], [place] [address]                Office [phone]  Fax [phone]    Normal

## 2018-08-21 NOTE — Op Note (Signed)
OPERATIVE REPORT Patient name: Jill Nunez MRN: [id number] DOB: 1965/06/27  DOS:  08/08/2018  Preop Dx: DJD/hallux limitus left.  Hammertoe digits 2, 3 left. Postop Dx: same  Procedure:  1.  First MTPJ arthroplasty with Silastic implant left 2.  PIPJ arthroplasty second digit left 3.  PIPJ arthroplasty third digit left 4.  MTPJ capsulotomy second digit left 5.  MTPJ capsulotomy third digit left  Surgeon: Edrick Kins DPM  Anesthesia: General anesthesia  Hemostasis: Ankle tourniquet inflated to a pressure of 243mmHg after esmarch exsanguination   EBL: Minimal mL Materials: Integra flexible Primus great toe implant Injectables: 20 cc of 0.5% Marcaine plain in an ankle block fashion Pathology: None  Condition: The patient tolerated the procedure and anesthesia well. No complications noted or reported   Justification for procedure: The patient is a 53 y.o. female who presents today for surgical correction of hallux limitus/DJD to the first MTPJ left foot as well as hammertoe contractures digits 2, 3 left foot. All conservative modalities of been unsuccessful in providing any sort of satisfactory alleviation of symptoms with the patient. The patient was told benefits as well as possible side effects of the surgery. The patient consented for surgical correction. The patient consent form was reviewed. All patient questions were answered. No guarantees were expressed or implied. The patient and the surgeon boson the patient consent form with the witness present and placed in the patient's chart.   Procedure in Detail: The patient was brought to the operating room, placed in the operating table in the supine position at which time an aseptic scrub and drape were performed about the patient's respective lower extremity after anesthesia was induced as described above. Attention was then directed to the surgical area where procedure number one commenced.  Procedure #1: First MTPJ  arthroplasty with Silastic implant left A 5 cm linear longitudinal skin incision was planned and made overlying the first MTPJ of the respective foot.  Incision was carried down to the level of joint capsule with care taken to cut clamp ligate and retract away all small neurovascular structures traversing the incision site.  Capsulotomy was performed to expose the underlying joint.  Careful soft tissue and periosteal dissection was utilized to reflect away the soft tissues from the joint in preparation for the arthroplasty/implant. Arthroplasty was performed with cutting guides and the cartilaginous surfaces of the head of the first metatarsal and base of the proximal phalanx were removed in toto.  Clide Dales was utilized to the proximal phalanx and first metatarsal in order to properly place the implant.  Stainless steel grommets placed over the osteotomies followed by the Silastic implant.  Implant was inserted as indicated.  Intraoperative x-ray fluoroscopy was utilized to visualize the placement of the implant which was satisfactory.  Irrigation was utilized and primary closure obtained using 3-0 Vicryl suture and 5-0 Monocryl.  Procedure #2: PIPJ arthroplasty second digit left foot Attention was then directed to the second digit left foot where a 4 cm linear longitudinal skin incision was planned and made.  Incision was carried down to the level of the EDL tendon with care taken to cut clamp ligate and retract away all small neurovascular structures traversing the incision site.  Z-plasty tenotomy was performed of the EDL tendon to expose the underlying joints.  The head of the proximal phalanx was then exposed and resected away using a sagittal blade mount on sagittal saw.  Kelikian maneuver indicated more proximal pathology existed of the hammertoe deformity.  Procedure #3: PIPJ arthroplasty third digit left foot Procedure #3 commenced in the exact same fashion manner as procedure #2 with the exception of  procedure #3 was performed to the third digit left foot  Procedure #4: MTPJ capsulotomy second digit left foot After completion of procedure #2 attention was then directed to the MTPJ of the respective digit.  The skin incision was extended proximal to expose the underlying MTPJ.  At this time a dorsal capsulotomy was performed and a McGlamry elevator was utilized to release the plantar apparatus and all scar tissue adhesions of the joint.  A second Kelikian maneuver indicated that all pathology contributory to the hammertoe deformity was alleviated or corrected.  Copious irrigation was then utilized in preparation for routine layered primary closure.  3-0 Vicryl suture was utilized to reapproximate the opposing ends of the Z-plasty tenotomy of the EDL tendon.  4-0 Vicryl suture utilized to reapproximate subcutaneous tissue followed by 4-0 Prolene suture to reapproximate superficial skin edges.  Procedure #5: MTPJ capsulotomy third digit left foot Procedure #5 commenced in the exact same fashion manner as procedure #4 with the exception of procedure #5 was performed to the third digit left foot.  Routine layered primary closure was achieved in the same manner  Dry sterile compressive dressings were then applied to all previously mentioned incision sites about the patient's lower extremity. The tourniquet which was used for hemostasis was deflated. All normal neurovascular responses including pink color and warmth returned all the digits of patient's lower extremity.  The patient was then transferred from the operating room to the recovery room having tolerated the procedure and anesthesia well. All vital signs are stable. After a brief stay in the recovery room the patient was discharged with adequate prescriptions for analgesia. Verbal as well as written instructions were provided for the patient regarding wound care. The patient is to keep the dressings clean dry and intact until they are to follow  surgeon Dr. Gala LewandowskyBrent Brix Brearley in the office upon discharge.   Felecia ShellingBrent M. Taggert Bozzi, DPM [facility]  Dr. Felecia ShellingBrent M. Shayana Hornstein, DPM    [address]                                        [place], [place] [address]                Office [phone]  Fax [phone]

## 2018-08-22 ENCOUNTER — Encounter: Payer: Self-pay | Admitting: Podiatry

## 2018-08-23 ENCOUNTER — Other Ambulatory Visit: Payer: Self-pay

## 2018-08-23 ENCOUNTER — Ambulatory Visit (INDEPENDENT_AMBULATORY_CARE_PROVIDER_SITE_OTHER): Payer: Self-pay | Admitting: Podiatry

## 2018-08-23 VITALS — Temp 97.7°F

## 2018-08-23 DIAGNOSIS — M2042 Other hammer toe(s) (acquired), left foot: Secondary | ICD-10-CM

## 2018-08-23 DIAGNOSIS — Z9889 Other specified postprocedural states: Secondary | ICD-10-CM

## 2018-08-23 DIAGNOSIS — M205X2 Other deformities of toe(s) (acquired), left foot: Secondary | ICD-10-CM

## 2018-08-25 NOTE — Progress Notes (Signed)
   Subjective:  Patient presents today status post left great toe implant and hammertoe repair of digits 2 and 3 of the left foot. DOS: 08/08/2018. She states she is doing well and improving. She denies any significant pain or modifying factors. Se has been using the CAM boot as directed. Patient is here for further evaluation and treatment.    Past Medical History:  Diagnosis Date  . Anemia    PMH: iron deficiency ; now resolved  . Anxiety   . Arthritis   . GERD (gastroesophageal reflux disease)    PMH: resolved  . Headache   . Heart murmur   . History of hiatal hernia     PMH: repaired with bariatric surgery  . Hypertension   . Insomnia   . Pneumonia    young adult  . Wears glasses       Objective/Physical Exam Neurovascular status intact.  Skin incisions appear to be well coapted with sutures and staples intact. No sign of infectious process noted. No dehiscence. No active bleeding noted. Moderate edema noted to the surgical extremity.  Assessment: 1. s/p left great toe implant; hammertoe repair 2, 3 left. DOS: 08/08/2018   Plan of Care:  1. Patient was evaluated. 2. Sutures removed.  3. Continue weightbearing in CAM boot.  4. Return to clinic in two weeks for percutaneous pin removal.    Edrick Kins, DPM [facility]  Dr. Edrick Kins, [address]                                        [place], [place] [address]                Office [phone]  Fax [phone]

## 2018-09-03 ENCOUNTER — Ambulatory Visit (INDEPENDENT_AMBULATORY_CARE_PROVIDER_SITE_OTHER): Payer: Non-veteran care | Admitting: Podiatry

## 2018-09-03 ENCOUNTER — Other Ambulatory Visit: Payer: Self-pay

## 2018-09-03 ENCOUNTER — Ambulatory Visit (INDEPENDENT_AMBULATORY_CARE_PROVIDER_SITE_OTHER): Payer: No Typology Code available for payment source

## 2018-09-03 ENCOUNTER — Encounter: Payer: Self-pay | Admitting: Podiatry

## 2018-09-03 VITALS — Temp 97.1°F

## 2018-09-03 DIAGNOSIS — M2042 Other hammer toe(s) (acquired), left foot: Secondary | ICD-10-CM | POA: Diagnosis not present

## 2018-09-03 DIAGNOSIS — Z9889 Other specified postprocedural states: Secondary | ICD-10-CM

## 2018-09-03 DIAGNOSIS — M205X2 Other deformities of toe(s) (acquired), left foot: Secondary | ICD-10-CM | POA: Diagnosis not present

## 2018-09-03 MED ORDER — OXYCODONE-ACETAMINOPHEN 5-325 MG PO TABS: 1.0000 | ORAL_TABLET | ORAL | 0 refills | Status: DC | PRN

## 2018-09-04 NOTE — Progress Notes (Signed)
   Subjective:  Patient presents today status post left great toe implant and hammertoe repair of digits 2 and 3 of the left foot. DOS: 08/08/2018. She reports an increase in pain that began in the past 6 days. She reports feeling a lot of pressure in the foot. Walking and standing increase her pain and discomfort. She states the increase in pain began after she began showering again. Patient is here for further evaluation and treatment.    Past Medical History:  Diagnosis Date  . Anemia    PMH: iron deficiency ; now resolved  . Anxiety   . Arthritis   . GERD (gastroesophageal reflux disease)    PMH: resolved  . Headache   . Heart murmur   . History of hiatal hernia     PMH: repaired with bariatric surgery  . Hypertension   . Insomnia   . Pneumonia    young adult  . Wears glasses       Objective/Physical Exam Neurovascular status intact.  Skin incisions appear to be well coapted. No sign of infectious process noted. No dehiscence. No active bleeding noted. Moderate edema noted to the surgical extremity.  Radiographic Exam:  Orthopedic hardware and osteotomies sites appear to be stable with routine healing.  Assessment: 1. s/p left great toe implant; hammertoe repair 2, 3 left. DOS: 08/08/2018   Plan of Care:  1. Patient was evaluated. X-Rays reviewed.  2. Percutaneous pins removed.  3. Transition into good shoe gear.  4. Refill prescription for Percocet 5/325 mg provided to patient.  5. Return to clinic in 4 weeks.    Edrick Kins, DPM [facility]  Dr. Edrick Kins, [address]                                        [place], [place] [address]                Office [phone]  Fax [phone]

## 2018-10-01 ENCOUNTER — Other Ambulatory Visit: Payer: Non-veteran care

## 2018-10-02 ENCOUNTER — Ambulatory Visit: Payer: Non-veteran care

## 2018-10-02 ENCOUNTER — Other Ambulatory Visit: Payer: Self-pay

## 2018-10-02 ENCOUNTER — Ambulatory Visit (INDEPENDENT_AMBULATORY_CARE_PROVIDER_SITE_OTHER): Payer: Non-veteran care

## 2018-10-02 VITALS — Temp 97.7°F

## 2018-10-02 DIAGNOSIS — M205X2 Other deformities of toe(s) (acquired), left foot: Secondary | ICD-10-CM

## 2018-10-02 DIAGNOSIS — M2042 Other hammer toe(s) (acquired), left foot: Secondary | ICD-10-CM

## 2018-10-02 DIAGNOSIS — Z9889 Other specified postprocedural states: Secondary | ICD-10-CM

## 2018-11-05 ENCOUNTER — Encounter: Payer: Non-veteran care | Admitting: Podiatry

## 2018-11-06 ENCOUNTER — Other Ambulatory Visit: Payer: Self-pay

## 2018-11-06 DIAGNOSIS — Z20822 Contact with and (suspected) exposure to covid-19: Secondary | ICD-10-CM

## 2018-11-07 ENCOUNTER — Telehealth: Payer: Self-pay | Admitting: *Deleted

## 2018-11-07 LAB — NOVEL CORONAVIRUS, NAA: SARS-CoV-2, NAA: NOT DETECTED

## 2018-11-07 NOTE — Telephone Encounter (Signed)
Pt calling back for covid results; negative. Verbalizes understanding.

## 2019-02-07 ENCOUNTER — Encounter: Payer: Non-veteran care | Admitting: Podiatry

## 2019-02-11 ENCOUNTER — Ambulatory Visit (INDEPENDENT_AMBULATORY_CARE_PROVIDER_SITE_OTHER): Payer: No Typology Code available for payment source | Admitting: Podiatry

## 2019-02-11 ENCOUNTER — Other Ambulatory Visit: Payer: Self-pay

## 2019-02-11 ENCOUNTER — Encounter: Payer: Self-pay | Admitting: Podiatry

## 2019-02-11 DIAGNOSIS — M2042 Other hammer toe(s) (acquired), left foot: Secondary | ICD-10-CM

## 2019-02-11 DIAGNOSIS — M205X2 Other deformities of toe(s) (acquired), left foot: Secondary | ICD-10-CM

## 2019-02-14 NOTE — Progress Notes (Signed)
   Subjective:  Patient presents today status post left great toe implant and hammertoe repair of digits 2 and 3 of the left foot. DOS: 08/08/2018. She states she is doing well. She reports some pain and swelling after being on the foot for long periods of time. She has been taking Percocet and wearing supportive shoes to alleviate her symptoms. Patient is here for further evaluation and treatment.    Past Medical History:  Diagnosis Date  . Anemia    PMH: iron deficiency ; now resolved  . Anxiety   . Arthritis   . GERD (gastroesophageal reflux disease)    PMH: resolved  . Headache   . Heart murmur   . History of hiatal hernia     PMH: repaired with bariatric surgery  . Hypertension   . Insomnia   . Pneumonia    young adult  . Wears glasses       Objective/Physical Exam Neurovascular status intact.  Skin incisions appear to be well coapted. No sign of infectious process noted. No dehiscence. No active bleeding noted. Moderate edema noted to the surgical extremity.  Assessment: 1. s/p left great toe implant; hammertoe repair 2, 3 left. DOS: 08/08/2018   Plan of Care:  1. Patient was evaluated.  2. Recommended daily ROM exercises.  3. Recommended good shoe gear.  4. May resume full activity with no restrictions.  5. Return to clinic as needed.    Edrick Kins, DPM [facility]  Dr. Edrick Kins, [address]                                        [place], [place] [address]                Office [phone]  Fax [phone]

## 2019-06-27 ENCOUNTER — Ambulatory Visit (INDEPENDENT_AMBULATORY_CARE_PROVIDER_SITE_OTHER): Payer: Non-veteran care

## 2019-06-27 ENCOUNTER — Other Ambulatory Visit: Payer: Self-pay

## 2019-06-27 ENCOUNTER — Ambulatory Visit (INDEPENDENT_AMBULATORY_CARE_PROVIDER_SITE_OTHER): Payer: No Typology Code available for payment source | Admitting: Podiatry

## 2019-06-27 DIAGNOSIS — M2041 Other hammer toe(s) (acquired), right foot: Secondary | ICD-10-CM | POA: Diagnosis not present

## 2019-06-27 NOTE — Patient Instructions (Signed)
Pre-Operative Instructions  Congratulations, you have decided to take an important step towards improving your quality of life.  You can be assured that the doctors and staff at [facility] will be with you every step of the way.  Here are some important things you should know:  1. Plan to be at the surgery center/hospital at least 1 (one) hour prior to your scheduled time, unless otherwise directed by the surgical center/hospital staff.  You must have a responsible adult accompany you, remain during the surgery and drive you home.  Make sure you have directions to the surgical center/hospital to ensure you arrive on time. 2. If you are having surgery at [facility] or [facility] hospitals, you will need a copy of your medical history and physical form from your family physician within one month prior to the date of surgery. We will give you a form for your primary physician to complete.  3. We make every effort to accommodate the date you request for surgery.  However, there are times where surgery dates or times have to be moved.  We will contact you as soon as possible if a change in schedule is required.   4. No aspirin/ibuprofen for one week before surgery.  If you are on aspirin, any non-steroidal anti-inflammatory medications (Mobic, Aleve, Ibuprofen) should not be taken seven (7) days prior to your surgery.  You make take Tylenol for pain prior to surgery.  5. Medications - If you are taking daily heart and blood pressure medications, seizure, reflux, allergy, asthma, anxiety, pain or diabetes medications, make sure you notify the surgery center/hospital before the day of surgery so they can tell you which medications you should take or avoid the day of surgery. 6. No food or drink after midnight the night before surgery unless directed otherwise by surgical center/hospital staff. 7. No alcoholic beverages 24-hours prior to surgery.  No smoking 24-hours prior or 24-hours after  surgery. 8. Wear loose pants or shorts. They should be loose enough to fit over bandages, boots, and casts. 9. Don't wear slip-on shoes. Sneakers are preferred. 10. Bring your boot with you to the surgery center/hospital.  Also bring crutches or a walker if your physician has prescribed it for you.  If you do not have this equipment, it will be provided for you after surgery. 11. If you have not been contacted by the surgery center/hospital by the day before your surgery, call to confirm the date and time of your surgery. 12. Leave-time from work may vary depending on the type of surgery you have.  Appropriate arrangements should be made prior to surgery with your employer. 13. Prescriptions will be provided immediately following surgery by your doctor.  Fill these as soon as possible after surgery and take the medication as directed. Pain medications will not be refilled on weekends and must be approved by the doctor. 14. Remove nail polish on the operative foot and avoid getting pedicures prior to surgery. 15. Wash the night before surgery.  The night before surgery wash the foot and leg well with water and the antibacterial soap provided. Be sure to pay special attention to beneath the toenails and in between the toes.  Wash for at least three (3) minutes. Rinse thoroughly with water and dry well with a towel.  Perform this wash unless told not to do so by your physician.  Enclosed: 1 Ice pack (please put in freezer the night before surgery)   1 Hibiclens skin cleaner     Pre-op instructions  If you have any questions regarding the instructions, please do not hesitate to call our office.  [place]: [address], [place], [place] [address] -- [phone]  [place]: [address] [place], [place] [address] -- [phone]  [place]: [address], [place], [place] [address] -- [phone]   Website: [email or web address] 

## 2019-07-01 NOTE — Progress Notes (Signed)
   HPI: 54 y.o. female presenting today with a chief complaint of aching pain to digits 2-4 of the right foot that began about two years ago. Wearing certain shoes increases the pain. She has been trying to wear better shoes and taking OTC pain medication for treatment. Patient is here for further evaluation and treatment.   Past Medical History:  Diagnosis Date  . Anemia    PMH: iron deficiency ; now resolved  . Anxiety   . Arthritis   . GERD (gastroesophageal reflux disease)    PMH: resolved  . Headache   . Heart murmur   . History of hiatal hernia     PMH: repaired with bariatric surgery  . Hypertension   . Insomnia   . Pneumonia    young adult  . Wears glasses       Objective: Physical Exam General: The patient is alert and oriented x3 in no acute distress.  Dermatology: Skin is cool, dry and supple bilateral lower extremities. Negative for open lesions or macerations.  Vascular: Palpable pedal pulses bilaterally. No edema or erythema noted. Capillary refill within normal limits.  Neurological: Epicritic and protective threshold grossly intact bilaterally.   Musculoskeletal Exam: All pedal and ankle joints range of motion within normal limits bilateral. Muscle strength 5/5 in all groups bilateral. Hammertoe contracture deformity noted to digits 2, 3 of the right foot.  Radiographic Exam: Hammertoe contracture deformity noted to the interphalangeal joints and MPJ of the respective hammertoe digits mentioned on clinical musculoskeletal exam.     Assessment: 1. Hammertoe contracture noted to digits 2, 3 right 2. S/p left great toe implant; hammertoe repair 2, 3 left DOS: 08/08/2018   Plan of Care:  1. Patient evaluated. X-Rays reviewed.  2. Today we discussed the conservative versus surgical management of the presenting pathology. The patient opts for surgical management. All possible complications and details of the procedure were explained. All patient questions were  answered. No guarantees were expressed or implied. 3. Authorization for surgery was initiated today. Surgery will consist of PIPJ arthroplasty with MPJ capsulotomy 2nd right; possible Weil osteotomy 2, 3 right.  4. Return to clinic one week post op.   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     Jill Nunez, DPM [facility]  Dr. Felecia Nunez, DPM    [address] [place], [place] [address]                Office [phone]  Fax [phone]

## 2019-10-09 ENCOUNTER — Other Ambulatory Visit: Payer: Self-pay | Admitting: Podiatry

## 2019-10-09 MED ORDER — OXYCODONE-ACETAMINOPHEN 5-325 MG PO TABS: 1.0000 | ORAL_TABLET | ORAL | 0 refills | Status: DC | PRN

## 2019-10-09 MED ORDER — HYDROCODONE-ACETAMINOPHEN 10-325 MG PO TABS: 1.0000 | ORAL_TABLET | ORAL | 0 refills | Status: DC | PRN

## 2019-10-09 MED ORDER — IBUPROFEN 800 MG PO TABS: 800.0000 mg | ORAL_TABLET | Freq: Three times a day (TID) | ORAL | 1 refills | Status: DC

## 2019-10-09 NOTE — Progress Notes (Signed)
PRN postop 

## 2019-10-17 ENCOUNTER — Ambulatory Visit (INDEPENDENT_AMBULATORY_CARE_PROVIDER_SITE_OTHER): Payer: No Typology Code available for payment source

## 2019-10-17 ENCOUNTER — Ambulatory Visit (INDEPENDENT_AMBULATORY_CARE_PROVIDER_SITE_OTHER): Payer: No Typology Code available for payment source | Admitting: Podiatry

## 2019-10-17 ENCOUNTER — Encounter: Payer: Self-pay | Admitting: Podiatry

## 2019-10-17 ENCOUNTER — Other Ambulatory Visit: Payer: Self-pay

## 2019-10-17 VITALS — BP 131/74 | HR 72 | Temp 96.9°F

## 2019-10-17 DIAGNOSIS — M2041 Other hammer toe(s) (acquired), right foot: Secondary | ICD-10-CM | POA: Diagnosis not present

## 2019-10-17 DIAGNOSIS — Z9889 Other specified postprocedural states: Secondary | ICD-10-CM

## 2019-10-17 NOTE — Progress Notes (Signed)
   Subjective:  Patient presents today status post Weil osteotomy second and third metatarsals right foot as well as hammertoe repair with arthroplasty second digit right foot. DOS: 10/09/2019.  Patient states that she is doing very well.  She has been minimally weightbearing in the cam boot as directed.  Currently she is no longer taking any pain medications.  No new complaints at this time  Past Medical History:  Diagnosis Date  . Anemia    PMH: iron deficiency ; now resolved  . Anxiety   . Arthritis   . GERD (gastroesophageal reflux disease)    PMH: resolved  . Headache   . Heart murmur   . History of hiatal hernia     PMH: repaired with bariatric surgery  . Hypertension   . Insomnia   . Pneumonia    young adult  . Wears glasses       Objective/Physical Exam Neurovascular status intact.  Skin incisions appear to be well coapted with sutures and staples intact. No sign of infectious process noted. No dehiscence. No active bleeding noted. Moderate edema noted to the surgical extremity.  Radiographic Exam:  Orthopedic hardware and osteotomies sites appear to be stable with routine healing.  Assessment: 1. s/p Weil osteotomy second and third metatarsal right with hammertoe arthroplasty second digit right. DOS: 10/09/2019   Plan of Care:  1. Patient was evaluated. X-rays reviewed 2.  Continue minimal weightbearing in the cam boot with the assistance of a knee walker  3.  Dressing change today.  Keep clean dry and intact x1 week 4.  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  Has 2 staff.   Felecia Shelling, DPM [facility]  Dr. Felecia Shelling, DPM    [address]                                        [place], [place] [address]                Office [phone]  Fax [phone]

## 2019-10-19 DIAGNOSIS — M7751 Other enthesopathy of right foot: Secondary | ICD-10-CM

## 2019-10-19 DIAGNOSIS — M2041 Other hammer toe(s) (acquired), right foot: Secondary | ICD-10-CM

## 2019-10-19 DIAGNOSIS — M21541 Acquired clubfoot, right foot: Secondary | ICD-10-CM

## 2019-10-24 ENCOUNTER — Other Ambulatory Visit: Payer: Self-pay

## 2019-10-24 ENCOUNTER — Encounter: Payer: Self-pay | Admitting: Podiatry

## 2019-10-24 ENCOUNTER — Ambulatory Visit (INDEPENDENT_AMBULATORY_CARE_PROVIDER_SITE_OTHER): Payer: No Typology Code available for payment source

## 2019-10-24 ENCOUNTER — Ambulatory Visit (INDEPENDENT_AMBULATORY_CARE_PROVIDER_SITE_OTHER): Payer: No Typology Code available for payment source | Admitting: Podiatry

## 2019-10-24 DIAGNOSIS — M79675 Pain in left toe(s): Secondary | ICD-10-CM | POA: Diagnosis not present

## 2019-10-24 DIAGNOSIS — M2041 Other hammer toe(s) (acquired), right foot: Secondary | ICD-10-CM | POA: Diagnosis not present

## 2019-10-24 DIAGNOSIS — M79674 Pain in right toe(s): Secondary | ICD-10-CM

## 2019-10-24 DIAGNOSIS — B351 Tinea unguium: Secondary | ICD-10-CM

## 2019-10-24 DIAGNOSIS — Z9889 Other specified postprocedural states: Secondary | ICD-10-CM

## 2019-10-24 MED ORDER — OXYCODONE-ACETAMINOPHEN 5-325 MG PO TABS: 1.0000 | ORAL_TABLET | ORAL | 0 refills | Status: DC | PRN

## 2019-10-24 NOTE — Progress Notes (Signed)
   Subjective:  Patient presents today status post Weil osteotomy second and third metatarsals right foot as well as hammertoe repair with arthroplasty second digit right foot. DOS: 10/09/2019.  Patient has had a slight increase in pain over the last 3 days.  She is having trouble weightbearing without pain.  She has been wearing the cam boot as instructed.  Past Medical History:  Diagnosis Date  . Anemia    PMH: iron deficiency ; now resolved  . Anxiety   . Arthritis   . GERD (gastroesophageal reflux disease)    PMH: resolved  . Headache   . Heart murmur   . History of hiatal hernia     PMH: repaired with bariatric surgery  . Hypertension   . Insomnia   . Pneumonia    young adult  . Wears glasses       Objective/Physical Exam Neurovascular status intact.  Skin incisions appear to be well coapted with sutures intact. No sign of infectious process noted. No dehiscence. No active bleeding noted. Moderate edema noted to the surgical extremity.  Radiographic Exam:  Orthopedic hardware and osteotomies sites appear to be stable with routine healing.  There is some slight dorsal displacement of the screw in the metaphysis of the third metatarsal.  It appears stable though without displacement of the osteotomy.  Assessment: 1. s/p Weil osteotomy second and third metatarsal right with hammertoe arthroplasty second digit right. DOS: 10/09/2019   Plan of Care:  1. Patient was evaluated. X-rays reviewed 2.  Today I recommend nonweightbearing in the cam boot using the knee scooter 3.  Refill prescription for Percocet 5/325 mg 4.  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   Felecia Shelling, DPM [facility]  Dr. Felecia Shelling, DPM    [address]                                        [place], [place] [address]                Office [phone]  Fax [phone]

## 2019-11-06 ENCOUNTER — Other Ambulatory Visit: Payer: Self-pay

## 2019-11-06 ENCOUNTER — Ambulatory Visit (INDEPENDENT_AMBULATORY_CARE_PROVIDER_SITE_OTHER): Payer: No Typology Code available for payment source

## 2019-11-06 ENCOUNTER — Encounter: Payer: Self-pay | Admitting: Podiatry

## 2019-11-06 ENCOUNTER — Ambulatory Visit (INDEPENDENT_AMBULATORY_CARE_PROVIDER_SITE_OTHER): Payer: No Typology Code available for payment source | Admitting: Podiatry

## 2019-11-06 DIAGNOSIS — M2041 Other hammer toe(s) (acquired), right foot: Secondary | ICD-10-CM

## 2019-11-06 DIAGNOSIS — Z9889 Other specified postprocedural states: Secondary | ICD-10-CM

## 2019-11-07 ENCOUNTER — Encounter: Payer: Non-veteran care | Admitting: Podiatry

## 2019-11-11 ENCOUNTER — Encounter: Payer: Self-pay | Admitting: Podiatry

## 2019-11-11 NOTE — Progress Notes (Signed)
Subjective:  Patient ID: Jill Nunez, female    DOB: 05-17-1965,  MRN: [id number]  Chief Complaint  Patient presents with  . Routine Post Op    DOS 10/09/19 HAMMERTOE REPAIR W/CAPSULOTOMY SECOND DIGIT RT, METATARSAL SHORTENING (WEIL) OSTEOTOMY 2,3 RT      54 y.o. female returns for post-op check.  She states that she is doing well.  She does not like the pain.  She is very take the pain now.  She denies any other acute complaints.  She has been weightbearing as tolerated with the boot on.  She denies any other acute complaints.  Review of Systems: Negative except as noted in the HPI. Denies N/V/F/Ch.  Past Medical History:  Diagnosis Date  . Anemia    PMH: iron deficiency ; now resolved  . Anxiety   . Arthritis   . GERD (gastroesophageal reflux disease)    PMH: resolved  . Headache   . Heart murmur   . History of hiatal hernia     PMH: repaired with bariatric surgery  . Hypertension   . Insomnia   . Pneumonia    young adult  . Wears glasses     Current Outpatient Medications:  .  5-Hydroxytryptophan 200 MG CAPS, Take by mouth. Daily as needed, Disp: , Rfl:  .  acetaminophen (TYLENOL) 325 MG tablet, Take 650 mg by mouth 3 (three) times daily as needed for moderate pain or headache., Disp: , Rfl:  .  amLODipine (NORVASC) 10 MG tablet, Take 10 mg by mouth every evening. , Disp: , Rfl:  .  buPROPion (WELLBUTRIN XL) 300 MG 24 hr tablet, Take 300 mg by mouth daily., Disp: , Rfl:  .  clobetasol (TEMOVATE) 0.05 % external solution, Apply 1 application topically daily as needed (irritation)., Disp: , Rfl:  .  estradiol (ESTRACE) 0.1 MG/GM vaginal cream, Place 1 Applicatorful vaginally at bedtime as needed for irritation., Disp: , Rfl:  .  fluticasone (FLONASE) 50 MCG/ACT nasal spray, Place 2 sprays into both nostrils daily., Disp: , Rfl:  .  ibuprofen (ADVIL) 800 MG tablet, Take 1 tablet (800 mg total) by mouth 3 (three) times daily., Disp: 90 tablet, Rfl: 1 .  ketotifen (ZADITOR)  0.025 % ophthalmic solution, Place 1 drop into both eyes 2 (two) times daily., Disp: , Rfl:  .  oxyCODONE-acetaminophen (PERCOCET) 5-325 MG tablet, Take 1 tablet by mouth every 4 (four) hours as needed for severe pain., Disp: 30 tablet, Rfl: 0 .  traMADol (ULTRAM) 50 MG tablet, Take 100 mg by mouth 3 (three) times daily as needed for moderate pain. , Disp: , Rfl:  .  traZODone (DESYREL) 100 MG tablet, Take 100 mg by mouth at bedtime as needed for sleep., Disp: , Rfl:   Social History   Tobacco Use  Smoking Status Never Smoker  Smokeless Tobacco Never Used    Allergies  Allergen Reactions  . Codeine Rash and Swelling  . Capsaicin     " burning reaction "   Objective:  There were no vitals filed for this visit. There is no height or weight on file to calculate BMI. Constitutional Well developed. Well nourished.  Vascular Foot warm and well perfused. Capillary refill normal to all digits.   Neurologic Normal speech. Oriented to person, place, and time. Epicritic sensation to light touch grossly present bilaterally.  Dermatologic Skin healing well without signs of infection. Skin edges well coapted without signs of infection.  Orthopedic: Tenderness to palpation noted about the surgical site.  Radiographs: 3 views of skeletally mature adult right foot: Hardware is intact.  No signs of breaking noted.  Good correction alignment noted. Assessment:   1. Hammertoe of right foot   2. S/P foot surgery, right    Plan:  Patient was evaluated and treated and all questions answered.  S/p foot surgery right -Progressing as expected post-operatively. -XR: See above -WB Status: Weightbearing as tolerated in cam boot -Sutures: Pin was taken out without acute complication.  The pin was removed in its entirety.  The pin site was covered with triple antibiotic and a Band-Aid.. -Medications: None -Foot redressed.  No follow-ups on file.

## 2019-11-21 ENCOUNTER — Ambulatory Visit (INDEPENDENT_AMBULATORY_CARE_PROVIDER_SITE_OTHER): Payer: No Typology Code available for payment source | Admitting: Podiatry

## 2019-11-21 ENCOUNTER — Telehealth: Payer: Self-pay | Admitting: Podiatry

## 2019-11-21 ENCOUNTER — Ambulatory Visit (INDEPENDENT_AMBULATORY_CARE_PROVIDER_SITE_OTHER): Payer: No Typology Code available for payment source

## 2019-11-21 ENCOUNTER — Other Ambulatory Visit: Payer: Self-pay

## 2019-11-21 DIAGNOSIS — M2041 Other hammer toe(s) (acquired), right foot: Secondary | ICD-10-CM | POA: Diagnosis not present

## 2019-11-21 DIAGNOSIS — Z9889 Other specified postprocedural states: Secondary | ICD-10-CM

## 2019-11-21 MED ORDER — OXYCODONE-ACETAMINOPHEN 5-325 MG PO TABS: 1.0000 | ORAL_TABLET | ORAL | 0 refills | Status: DC | PRN

## 2019-11-21 NOTE — Telephone Encounter (Signed)
[facility] referral extension faxed on 11/20/19 

## 2019-11-30 NOTE — Progress Notes (Signed)
   Subjective:  Patient presents today status post Weil osteotomy second and third metatarsals right foot as well as hammertoe repair with arthroplasty second digit right foot. DOS: 10/09/2019.  Patient continues to have some swelling and tenderness to the surgical forefoot.  She has been wearing the cam boot as instructed.  The more she is on her feet the more swelling she gets.  Currently she is taking Percocet to alleviate her pain.  She presents for further treatment and evaluation  Past Medical History:  Diagnosis Date  . Anemia    PMH: iron deficiency ; now resolved  . Anxiety   . Arthritis   . GERD (gastroesophageal reflux disease)    PMH: resolved  . Headache   . Heart murmur   . History of hiatal hernia     PMH: repaired with bariatric surgery  . Hypertension   . Insomnia   . Pneumonia    young adult  . Wears glasses       Objective/Physical Exam Neurovascular status intact.  Skin incisions appear to be well coapted and healed. No sign of infectious process noted. No dehiscence. No active bleeding noted. Moderate edema noted to the surgical extremity.  There is also some tenderness to palpation along the third metatarsal and overall of the surgical area  Radiographic Exam:  Orthopedic hardware and osteotomies sites appear to be stable with routine healing.  There is some slight dorsal displacement of the screw in the metaphysis of the third metatarsal.  It appears stable though without displacement of the osteotomy.  Assessment: 1. s/p Weil osteotomy second and third metatarsal right with hammertoe arthroplasty second digit right. DOS: 10/09/2019   Plan of Care:  1. Patient was evaluated. X-rays reviewed 2.  Patient may now discontinue the cam boot.  Postsurgical shoe dispensed. 3.  Refill prescription for Percocet 5/325 mg 4.  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  Has 2 staff.   Felecia Shelling, DPM [facility]  Dr. Felecia Shelling, DPM    [address]                                        [place], [place] [address]                Office [phone]  Fax [phone]

## 2019-12-02 ENCOUNTER — Telehealth: Payer: Self-pay

## 2019-12-02 NOTE — Telephone Encounter (Signed)
Patient called stated that her foot is still painful and throbs all day.  She is requesting a prescription for Tramadol.  Please advise if ok to call to her pharmacy.  Thanks

## 2019-12-30 ENCOUNTER — Ambulatory Visit (INDEPENDENT_AMBULATORY_CARE_PROVIDER_SITE_OTHER): Payer: No Typology Code available for payment source | Admitting: Podiatry

## 2019-12-30 ENCOUNTER — Ambulatory Visit (INDEPENDENT_AMBULATORY_CARE_PROVIDER_SITE_OTHER): Payer: No Typology Code available for payment source

## 2019-12-30 ENCOUNTER — Other Ambulatory Visit: Payer: Self-pay

## 2019-12-30 DIAGNOSIS — M2041 Other hammer toe(s) (acquired), right foot: Secondary | ICD-10-CM | POA: Diagnosis not present

## 2019-12-30 DIAGNOSIS — Z9889 Other specified postprocedural states: Secondary | ICD-10-CM

## 2019-12-30 NOTE — Progress Notes (Signed)
   Subjective:  Patient presents today status post Weil osteotomy second and third metatarsals right foot as well as hammertoe repair with arthroplasty second digit right foot. DOS: 10/09/2019.  Patient states that she is doing significantly better.  She recently went on a cruise to the [place] and she only had some very mild to minimal tenderness at the end of her 6-day cruise.  Otherwise she is doing very well.  She has been applying a Band-Aid to the second toe because it is slightly elevated.  No new complaints this time  Past Medical History:  Diagnosis Date  . Anemia    PMH: iron deficiency ; now resolved  . Anxiety   . Arthritis   . GERD (gastroesophageal reflux disease)    PMH: resolved  . Headache   . Heart murmur   . History of hiatal hernia     PMH: repaired with bariatric surgery  . Hypertension   . Insomnia   . Pneumonia    young adult  . Wears glasses       Objective/Physical Exam Neurovascular status intact.  Skin incisions appear to be well coapted and healed. No sign of infectious process noted. No dehiscence. No active bleeding noted.  Minimal edema noted to the surgical extremity.  Negative for any significant tenderness to palpation today.  The second toe is slightly dorsiflexed compared to the remaining adjacent digits.  Likely due to scar tissue adhesion  Radiographic Exam:  No change since prior x-rays taken.  The displacement of the screws and the metatarsal heads appears stable for the most part without movement compared to prior x-rays.  Assessment: 1. s/p Weil osteotomy second and third metatarsal right with hammertoe arthroplasty second digit right. DOS: 10/09/2019   Plan of Care:  1. Patient was evaluated. X-rays reviewed 2.  Patient may now slowly return to full activity no restrictions.  Recommend good supportive shoes 3.  Recommend daily plantarflexion stretching exercises to the second toe to plantarflex the toe and break up any scar tissue  adhesion 4.  Return to clinic as needed  *Attorney in [place], [place].  Mostly criminal law.  Has 2 staff.   Felecia Shelling, DPM [facility]  Dr. Felecia Shelling, DPM    [address]                                        [place], [place] [address]                Office [phone]  Fax [phone]

## 2021-04-22 ENCOUNTER — Ambulatory Visit (INDEPENDENT_AMBULATORY_CARE_PROVIDER_SITE_OTHER): Payer: No Typology Code available for payment source | Admitting: Podiatry

## 2021-04-22 ENCOUNTER — Other Ambulatory Visit: Payer: Self-pay

## 2021-04-22 ENCOUNTER — Ambulatory Visit (INDEPENDENT_AMBULATORY_CARE_PROVIDER_SITE_OTHER): Payer: No Typology Code available for payment source

## 2021-04-22 ENCOUNTER — Other Ambulatory Visit: Payer: Self-pay | Admitting: Podiatry

## 2021-04-22 DIAGNOSIS — M205X1 Other deformities of toe(s) (acquired), right foot: Secondary | ICD-10-CM | POA: Diagnosis not present

## 2021-04-22 DIAGNOSIS — M21611 Bunion of right foot: Secondary | ICD-10-CM

## 2021-04-22 NOTE — Progress Notes (Signed)
HPI: 56 y.o. female presenting today for evaluation of right great toe pain that is developed over the last year.  Patient was last seen in the office on 12/30/2019.  Patient states that over the past year she has had increasing pain and tenderness associated to the right great toe joint.  He is very stiff and achy throughout the day.  It is affecting her daily activities.  She enjoys walking but she is unable to do so without significant pain and tenderness associated to the right great toe joint. Patient does have history of bunion surgery at the [place] as well as surgery by me 10/09/2019 to address her hammertoes and metatarsalgia to the second third digits right foot.  Patient states that her second third toe feels fine and she does not have any pain associated to this area  Past Medical History:  Diagnosis Date   Anemia    PMH: iron deficiency ; now resolved   Anxiety    Arthritis    GERD (gastroesophageal reflux disease)    PMH: resolved   Headache    Heart murmur    History of hiatal hernia     PMH: repaired with bariatric surgery   Hypertension    Insomnia    Pneumonia    young adult   Wears glasses     Past Surgical History:  Procedure Laterality Date   APPENDECTOMY     BUNIONECTOMY     right   BUNIONECTOMY Left 08/08/2018   Procedure: BUNIONECTOMY;  Surgeon: Edrick Kins, DPM;  Location: [facility];  Service: Podiatry;  Laterality: Left;   CAPSULOTOMY Left 08/08/2018   Procedure: CAPSULOTOMY SECOND AND THIRD LEFT;  Surgeon: Edrick Kins, DPM;  Location: [facility];  Service: Podiatry;  Laterality: Left;   CHOLECYSTECTOMY     FOOT SURGERY     left   HAMMER TOE SURGERY Left 08/08/2018   Procedure: HAMMER TOE CORRECTION SECOND AND THIRD LEFT;  Surgeon: Edrick Kins, DPM;  Location: [facility];  Service: Podiatry;  Laterality: Left;   HERNIA REPAIR     hiatal hernia repair during bastric bypass surgery   IMPLANT OF A SILASTIC METATARSAL PHALANGE JOINT Left 08/08/2018   Procedure: IMPLANT OF  A SILASTIC METATARSAL PHALANGE JOINT;  Surgeon: Edrick Kins, DPM;  Location: [facility];  Service: Podiatry;  Laterality: Left;   ROUX-EN-Y GASTRIC BYPASS     hiatal hernia repaired during this produre   uterine ablation      Allergies  Allergen Reactions   Codeine Rash and Swelling   Capsaicin     " burning reaction "   Trazodone Swelling     Physical Exam: General: The patient is alert and oriented x3 in no acute distress.  Dermatology: Skin is warm, dry and supple bilateral lower extremities. Negative for open lesions or macerations.  Vascular: Palpable pedal pulses bilaterally. Capillary refill within normal limits.  Negative for any significant edema or erythema  Neurological: Light touch and protective threshold grossly intact  Musculoskeletal Exam: There is some pain on palpation and range of motion of the first MTP joint of the right foot.  Limited range of motion as well.  Findings are consistent with a hallux limitus.  The head of the orthopedic screw in the first metatarsal is also palpable clinically  Radiographic Exam:  Normal osseous mineralization.  Degenerative changes noted to the first MTP joint consistent with a hallux limitus.  Orthopedic screw to the first second and third metatarsals are  stable and intact.  No fractures identified.  Assessment: 1.  Hallux limitus right great toe 2.  Symptomatic orthopedic screw first metatarsal head right 3. H/o prior bunion and hammertoe surgery 1, 2 right   Plan of Care:  1. Patient evaluated. X-Rays reviewed.  2. Today we discussed the conservative versus surgical management of the presenting pathology. The patient opts for surgical management. All possible complications and details of the procedure were explained. All patient questions were answered. No guarantees were expressed or implied. 3.  Patient states that the right great toe pain is not affecting her daily activities and is painful on a daily basis.  She would  like to proceed with surgery to have it corrected for.  She has tried multiple shoe gear modifications with no improvement.   4.  Authorization for surgery was initiated today. Surgery will consist of removal of orthopedic screw right first metatarsal.  Right great toe arthroplasty with implant. 5.  Return to clinic 1 week postop  Attorney in [place], [place].  Mostly criminal law.  Has 2 staff.   Edrick Kins, DPM [facility]  Dr. Edrick Kins, DPM    [address], [place] [address]                Office [phone]  Fax [phone]

## 2021-04-27 ENCOUNTER — Ambulatory Visit: Payer: No Typology Code available for payment source | Admitting: Podiatry

## 2021-08-16 ENCOUNTER — Other Ambulatory Visit: Payer: Self-pay | Admitting: Podiatry

## 2021-08-16 DIAGNOSIS — M205X1 Other deformities of toe(s) (acquired), right foot: Secondary | ICD-10-CM

## 2021-08-18 ENCOUNTER — Other Ambulatory Visit: Payer: Self-pay | Admitting: Podiatry

## 2021-08-18 DIAGNOSIS — M205X1 Other deformities of toe(s) (acquired), right foot: Secondary | ICD-10-CM

## 2021-08-18 DIAGNOSIS — Z4889 Encounter for other specified surgical aftercare: Secondary | ICD-10-CM

## 2021-08-18 MED ORDER — IBUPROFEN 800 MG PO TABS: 800.0000 mg | ORAL_TABLET | Freq: Four times a day (QID) | ORAL | 1 refills | Status: AC | PRN

## 2021-08-18 MED ORDER — OXYCODONE-ACETAMINOPHEN 5-325 MG PO TABS: 1.0000 | ORAL_TABLET | ORAL | 0 refills | Status: DC | PRN

## 2021-08-18 MED ORDER — IBUPROFEN 800 MG PO TABS: 800.0000 mg | ORAL_TABLET | Freq: Three times a day (TID) | ORAL | 1 refills | Status: DC

## 2021-08-18 NOTE — Progress Notes (Signed)
PRN postop 

## 2021-08-26 ENCOUNTER — Ambulatory Visit (INDEPENDENT_AMBULATORY_CARE_PROVIDER_SITE_OTHER): Payer: No Typology Code available for payment source

## 2021-08-26 ENCOUNTER — Ambulatory Visit (INDEPENDENT_AMBULATORY_CARE_PROVIDER_SITE_OTHER): Payer: No Typology Code available for payment source | Admitting: Podiatry

## 2021-08-26 DIAGNOSIS — Z9889 Other specified postprocedural states: Secondary | ICD-10-CM

## 2021-09-01 ENCOUNTER — Ambulatory Visit (INDEPENDENT_AMBULATORY_CARE_PROVIDER_SITE_OTHER): Payer: No Typology Code available for payment source | Admitting: Podiatry

## 2021-09-01 DIAGNOSIS — Z9889 Other specified postprocedural states: Secondary | ICD-10-CM

## 2021-09-01 MED ORDER — OXYCODONE-ACETAMINOPHEN 5-325 MG PO TABS: 1.0000 | ORAL_TABLET | ORAL | 0 refills | Status: DC | PRN

## 2021-09-08 NOTE — Progress Notes (Signed)
   Subjective:  Patient presents today status post right great toe arthroplasty with implant. DOS: 08/18/2021.  Patient states that she is doing well.  Minimal pain.  She has kept the dressings clean dry and intact.  No new complaints at this time  Past Medical History:  Diagnosis Date   Anemia    PMH: iron deficiency ; now resolved   Anxiety    Arthritis    GERD (gastroesophageal reflux disease)    PMH: resolved   Headache    Heart murmur    History of hiatal hernia     PMH: repaired with bariatric surgery   Hypertension    Insomnia    Pneumonia    young adult   Wears glasses      Past Surgical History:  Procedure Laterality Date   APPENDECTOMY     BUNIONECTOMY     right   BUNIONECTOMY Left 08/08/2018   Procedure: BUNIONECTOMY;  Surgeon: Felecia Shelling, DPM;  [facility];  Service: Podiatry;  Laterality: Left;   CAPSULOTOMY Left 08/08/2018   Procedure: CAPSULOTOMY SECOND AND THIRD LEFT;  Surgeon: Felecia Shelling, DPM;  [facility];  Service: Podiatry;  Laterality: Left;   CHOLECYSTECTOMY     FOOT SURGERY     left   HAMMER TOE SURGERY Left 08/08/2018   Procedure: HAMMER TOE CORRECTION SECOND AND THIRD LEFT;  Surgeon: Felecia Shelling, DPM;  [facility];  Service: Podiatry;  Laterality: Left;   HERNIA REPAIR     hiatal hernia repair during bastric bypass surgery   IMPLANT OF A SILASTIC METATARSAL PHALANGE JOINT Left 08/08/2018   Procedure: IMPLANT OF A SILASTIC METATARSAL PHALANGE JOINT;  Surgeon: Felecia Shelling, DPM;  [facility];  Service: Podiatry;  Laterality: Left;   ROUX-EN-Y GASTRIC BYPASS     hiatal hernia repaired during this produre   uterine ablation     Allergies  Allergen Reactions   Codeine Rash and Swelling   Capsaicin     " burning reaction "   Trazodone Swelling     Objective/Physical Exam Neurovascular status intact.  Skin incisions appear to be well coapted with sutures intact. No sign of infectious process noted. No dehiscence. No active  bleeding noted.  Negative for any significant edema.  Radiographic Exam:  Arthroplasty right great toe first MTP joint with implant noted.  Good alignment of the first ray.  Orthopedic screws also noted distal metatarsals second and third which are intact and stable  Assessment: 1. s/p right great toe arthroplasty with implant. DOS: 08/18/2021   Plan of Care:  1. Patient was evaluated. X-rays reviewed 2.  Patient is doing well.  She is here to get her sutures out.  Sutures removed no signs of Deis is noted no complication noted. 3.  Continue weightbearing in the postsurgical shoe.  4.  Return to clinic 2 weeks final clearance by Dr. Logan Bores  *Attorney in [place], [place].  Mostly criminal law.  Has 2 staff.  Long-term partner is Delila Pereyra who is present today.

## 2021-09-09 ENCOUNTER — Ambulatory Visit (INDEPENDENT_AMBULATORY_CARE_PROVIDER_SITE_OTHER): Payer: No Typology Code available for payment source | Admitting: Podiatry

## 2021-09-09 ENCOUNTER — Encounter: Payer: Self-pay | Admitting: Podiatry

## 2021-09-09 DIAGNOSIS — Z9889 Other specified postprocedural states: Secondary | ICD-10-CM

## 2021-09-09 NOTE — Progress Notes (Signed)
   Subjective:  Patient presents today status post right great toe arthroplasty with implant. DOS: 08/18/2021.  Patient doing well.  She is continues to have some pain and sensitivity to the area.  Overall no new complaints at this time  Past Medical History:  Diagnosis Date   Anemia    PMH: iron deficiency ; now resolved   Anxiety    Arthritis    GERD (gastroesophageal reflux disease)    PMH: resolved   Headache    Heart murmur    History of hiatal hernia     PMH: repaired with bariatric surgery   Hypertension    Insomnia    Pneumonia    young adult   Wears glasses      Past Surgical History:  Procedure Laterality Date   APPENDECTOMY     BUNIONECTOMY     right   BUNIONECTOMY Left 08/08/2018   Procedure: BUNIONECTOMY;  Surgeon: Felecia Shelling, DPM;  [facility];  Service: Podiatry;  Laterality: Left;   CAPSULOTOMY Left 08/08/2018   Procedure: CAPSULOTOMY SECOND AND THIRD LEFT;  Surgeon: Felecia Shelling, DPM;  [facility];  Service: Podiatry;  Laterality: Left;   CHOLECYSTECTOMY     FOOT SURGERY     left   HAMMER TOE SURGERY Left 08/08/2018   Procedure: HAMMER TOE CORRECTION SECOND AND THIRD LEFT;  Surgeon: Felecia Shelling, DPM;  [facility];  Service: Podiatry;  Laterality: Left;   HERNIA REPAIR     hiatal hernia repair during bastric bypass surgery   IMPLANT OF A SILASTIC METATARSAL PHALANGE JOINT Left 08/08/2018   Procedure: IMPLANT OF A SILASTIC METATARSAL PHALANGE JOINT;  Surgeon: Felecia Shelling, DPM;  [facility];  Service: Podiatry;  Laterality: Left;   ROUX-EN-Y GASTRIC BYPASS     hiatal hernia repaired during this produre   uterine ablation     Allergies  Allergen Reactions   Codeine Rash and Swelling   Capsaicin     " burning reaction "   Trazodone Swelling     Objective/Physical Exam Neurovascular status intact.  Skin incisions appear to be well coapted with sutures intact. No sign of infectious process noted. No dehiscence. No active bleeding  noted.  Negative for any significant edema.  Radiographic Exam 08/26/2021 RT foot:  Arthroplasty right great toe first MTP joint with implant noted.  Good alignment of the first ray.  Orthopedic screws also noted distal metatarsals second and third which are intact and stable  Assessment: 1. s/p right great toe arthroplasty with implant. DOS: 08/18/2021   Plan of Care:  1. Patient was evaluated. 2.  Sutures removed 3.  Patient may begin to transition out of the postsurgical shoe into good supportive shoes and sneakers 4.  Recommend daily range of motion exercises 5.  Return to clinic 4 weeks for follow-up x-ray  *Attorney in [place], [place].  Mostly criminal law.  Has 2 staff.  Long-term partner is Delila Pereyra who is present today.  Felecia Shelling, DPM [facility]  Dr. Felecia Shelling, DPM    [address] [place], [place] [address]                Office [phone]  Fax [phone]

## 2021-09-16 ENCOUNTER — Encounter: Payer: No Typology Code available for payment source | Admitting: Podiatry

## 2021-10-14 ENCOUNTER — Ambulatory Visit (INDEPENDENT_AMBULATORY_CARE_PROVIDER_SITE_OTHER): Payer: No Typology Code available for payment source

## 2021-10-14 ENCOUNTER — Ambulatory Visit (INDEPENDENT_AMBULATORY_CARE_PROVIDER_SITE_OTHER): Payer: No Typology Code available for payment source | Admitting: Podiatry

## 2021-10-14 DIAGNOSIS — Z9889 Other specified postprocedural states: Secondary | ICD-10-CM

## 2021-10-14 MED ORDER — TRAMADOL HCL 50 MG PO TABS: 50.0000 mg | ORAL_TABLET | ORAL | 0 refills | Status: AC | PRN

## 2021-10-14 MED ORDER — MELOXICAM 15 MG PO TABS: 15.0000 mg | ORAL_TABLET | Freq: Every day | ORAL | 1 refills | Status: DC

## 2021-10-14 NOTE — Progress Notes (Signed)
Chief Complaint  Patient presents with   Post-op Follow-up    POV #3 DOS 08/18/2021 REMOVAL ORTHOPEDIC SCREW RT, RT GREAT TOE ARTHROPLASTY W/IMPLANT    Subjective:  Patient presents today status post right great toe arthroplasty with implant. DOS: 08/18/2021.  Patient is doing well.  She is now in regular shoes and sneakers.  She does have some achiness especially by the end of the day.  No new complaints at this time  Past Medical History:  Diagnosis Date   Anemia    PMH: iron deficiency ; now resolved   Anxiety    Arthritis    GERD (gastroesophageal reflux disease)    PMH: resolved   Headache    Heart murmur    History of hiatal hernia     PMH: repaired with bariatric surgery   Hypertension    Insomnia    Pneumonia    young adult   Wears glasses      Past Surgical History:  Procedure Laterality Date   APPENDECTOMY     BUNIONECTOMY     right   BUNIONECTOMY Left 08/08/2018   Procedure: BUNIONECTOMY;  Surgeon: Felecia Shelling, DPM;  [facility];  Service: Podiatry;  Laterality: Left;   CAPSULOTOMY Left 08/08/2018   Procedure: CAPSULOTOMY SECOND AND THIRD LEFT;  Surgeon: Felecia Shelling, DPM;  [facility];  Service: Podiatry;  Laterality: Left;   CHOLECYSTECTOMY     FOOT SURGERY     left   HAMMER TOE SURGERY Left 08/08/2018   Procedure: HAMMER TOE CORRECTION SECOND AND THIRD LEFT;  Surgeon: Felecia Shelling, DPM;  [facility];  Service: Podiatry;  Laterality: Left;   HERNIA REPAIR     hiatal hernia repair during bastric bypass surgery   IMPLANT OF A SILASTIC METATARSAL PHALANGE JOINT Left 08/08/2018   Procedure: IMPLANT OF A SILASTIC METATARSAL PHALANGE JOINT;  Surgeon: Felecia Shelling, DPM;  [facility];  Service: Podiatry;  Laterality: Left;   ROUX-EN-Y GASTRIC BYPASS     hiatal hernia repaired during this produre   uterine ablation     Allergies  Allergen Reactions   Codeine Rash and Swelling   Capsaicin     " burning reaction "   Trazodone Swelling      Objective/Physical Exam Neurovascular status intact.  Skin incisions healed nicely.  Minimal edema noted.  There is some achiness with range of motion and deep tissue massage to the first MTP joint.  No sharp pains however.  There is some slight restricted range of motion of the first MTP joint.  Radiographic Exam 10/14/2021 RT foot:  Arthroplasty right great toe first MTP joint with implant noted.  Good alignment of the first ray.  Orthopedic screws also noted distal metatarsals second and third which are intact and stable.  Overall this appears to be a well-healing surgical foot based on radiographic exam  Assessment: 1. s/p right great toe arthroplasty with implant. DOS: 08/18/2021   Plan of Care:  1. Patient was evaluated. 2.  Overall the patient is doing well although she does continue to have some pain especially by the end of the day on very busy days when the patient [place]. 3.  Recommend Voltaren gel over the surgical area 2 times daily with massage 4.  Continue daily range of motion exercises to break up any scar tissue 5.  Prescription for tramadol 50 mg as needed 6.  Prescription for meloxicam  15 mg daily as needed 7.  Recommend good supportive shoes and sneakers especially when the patient is not working 8.  Return to clinic 3 months  *Attorney in [place], [place].  Mostly criminal law.  Has 2 staff.  Long-term partner is Delila Pereyra who is present today.  Felecia Shelling, DPM [facility]  Dr. Felecia Shelling, DPM    [address] [place], [place] [address]                Office [phone]  Fax [phone]

## 2021-11-09 ENCOUNTER — Telehealth: Payer: Self-pay | Admitting: Podiatry

## 2021-11-09 NOTE — Telephone Encounter (Signed)
Patient called she is having a lot of pain in her surgery foot, she cant put pressure on it, she resumed wearing the boot, but still can not walk on it.  Patient states she has been doing a lot of extra walking and standing on her foot, but no known injury.   She did not want to wait until Dr Logan Bores next available appt, she had me schedule her with Dr Allena Katz on 11/17/21, I did add her to the wait list for Dr Logan Bores

## 2021-11-09 NOTE — Telephone Encounter (Signed)
Please schedule as soon as possible,any availability this week?

## 2021-11-11 ENCOUNTER — Ambulatory Visit (INDEPENDENT_AMBULATORY_CARE_PROVIDER_SITE_OTHER): Payer: No Typology Code available for payment source | Admitting: Podiatry

## 2021-11-11 ENCOUNTER — Ambulatory Visit (INDEPENDENT_AMBULATORY_CARE_PROVIDER_SITE_OTHER): Payer: No Typology Code available for payment source

## 2021-11-11 DIAGNOSIS — M25871 Other specified joint disorders, right ankle and foot: Secondary | ICD-10-CM | POA: Diagnosis not present

## 2021-11-11 NOTE — Telephone Encounter (Signed)
Patient was seen this morning.  Thanks, Dr. Logan Bores

## 2021-11-11 NOTE — Progress Notes (Signed)
Chief Complaint  Patient presents with   Toe Pain    Patient states that for 3 days she has had swelling and pain, when she put her foot down had a lot of pain in right foot.    HPI: 56 y.o. female presenting today for an urgent work in regarding pain and tenderness to the right forefoot.  She does have history of right great toe arthroplasty with implant 08/18/2021.  Patient states that earlier this week she was working on her feet throughout the entire day and was doing an excessive amount of walking.  At that time she was wearing wedge shoes.  She says that after that day her foot was hurting significantly and she was unable to put much pressure on the foot.  She began wearing her surgical shoe and there has been some improvement.  She presents for further treatment and evaluation  Past Medical History:  Diagnosis Date   Anemia    PMH: iron deficiency ; now resolved   Anxiety    Arthritis    GERD (gastroesophageal reflux disease)    PMH: resolved   Headache    Heart murmur    History of hiatal hernia     PMH: repaired with bariatric surgery   Hypertension    Insomnia    Pneumonia    young adult   Wears glasses     Past Surgical History:  Procedure Laterality Date   APPENDECTOMY     BUNIONECTOMY     right   BUNIONECTOMY Left 08/08/2018   Procedure: BUNIONECTOMY;  Surgeon: Felecia Shelling, DPM;  Location: [facility] OR;  Service: Podiatry;  Laterality: Left;   CAPSULOTOMY Left 08/08/2018   Procedure: CAPSULOTOMY SECOND AND THIRD LEFT;  Surgeon: Felecia Shelling, DPM;  Location: [facility] OR;  Service: Podiatry;  Laterality: Left;   CHOLECYSTECTOMY     FOOT SURGERY     left   HAMMER TOE SURGERY Left 08/08/2018   Procedure: HAMMER TOE CORRECTION SECOND AND THIRD LEFT;  Surgeon: Felecia Shelling, DPM;  Location: [facility];  Service: Podiatry;  Laterality: Left;   HERNIA REPAIR     hiatal hernia repair during bastric bypass surgery   IMPLANT OF A SILASTIC METATARSAL PHALANGE JOINT Left 08/08/2018    Procedure: IMPLANT OF A SILASTIC METATARSAL PHALANGE JOINT;  Surgeon: Felecia Shelling, DPM;  Location: [facility];  Service: Podiatry;  Laterality: Left;   ROUX-EN-Y GASTRIC BYPASS     hiatal hernia repaired during this produre   uterine ablation      Allergies  Allergen Reactions   Codeine Rash and Swelling   Capsaicin     " burning reaction "   Trazodone Swelling     Physical Exam: General: The patient is alert and oriented x3 in no acute distress.  Dermatology: Skin is warm, dry and supple bilateral lower extremities. Negative for open lesions or macerations.  Vascular: Palpable pedal pulses bilaterally. Capillary refill within normal limits.  Negative for any significant edema or erythema  Neurological: Light touch and protective threshold grossly intact  Musculoskeletal Exam: No pedal deformities noted.  Pain with palpation to the sesamoidal apparatus of the right foot  Radiographic Exam:  Essentially unchanged and identical to prior x-rays.  Normal osseous mineralization.  Orthopedic implant and screws intact.  No acute fractures identified.  Assessment: 1.  Sesamoiditis right foot 2.  S/p right great toe arthroplasty with implant 08/18/2021  Plan of Care:  1. Patient evaluated. X-Rays reviewed.  2.  Patient currently wearing a postoperative shoe.  Continue x2-3 weeks until symptoms resolve 3.  Patient has anti-inflammatory at home.  Take daily as needed 4.  Advised against wearing wedge or heel shoes that increase pressure to the forefoot, especially on days where she is going to be on her feet for prolonged period of time 5.  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  Long-term partner is Delila Pereyra who is present today.     Felecia Shelling, DPM [facility]  Dr. Felecia Shelling, DPM    [address] [place], [place] [address]                Office [phone]  Fax [phone]

## 2021-11-17 ENCOUNTER — Ambulatory Visit: Payer: No Typology Code available for payment source | Admitting: Podiatry

## 2023-12-21 ENCOUNTER — Other Ambulatory Visit: Payer: Self-pay | Admitting: Physician Assistant

## 2023-12-21 DIAGNOSIS — Z1231 Encounter for screening mammogram for malignant neoplasm of breast: Secondary | ICD-10-CM

## 2023-12-27 ENCOUNTER — Ambulatory Visit
Admission: RE | Admit: 2023-12-27 | Discharge: 2023-12-27 | Disposition: A | Discharge status: Home / Self Care | Source: Ambulatory Visit | Attending: Physician Assistant | Admitting: Physician Assistant

## 2023-12-27 DIAGNOSIS — Z1231 Encounter for screening mammogram for malignant neoplasm of breast: Secondary | ICD-10-CM | POA: Insufficient documentation

## 2023-12-31 ENCOUNTER — Inpatient Hospital Stay
Admission: RE | Admit: 2023-12-31 | Discharge: 2023-12-31 | Disposition: A | Payer: Self-pay | Source: Ambulatory Visit | Attending: Physician Assistant | Admitting: Physician Assistant

## 2023-12-31 ENCOUNTER — Inpatient Hospital Stay
Admission: RE | Admit: 2023-12-31 | Discharge: 2023-12-31 | Disposition: A | Discharge status: Home / Self Care | Payer: Self-pay | Source: Ambulatory Visit | Attending: Physician Assistant | Admitting: Physician Assistant

## 2023-12-31 ENCOUNTER — Other Ambulatory Visit: Payer: Self-pay | Admitting: *Deleted

## 2023-12-31 DIAGNOSIS — Z1231 Encounter for screening mammogram for malignant neoplasm of breast: Secondary | ICD-10-CM

## 2024-01-20 NOTE — Progress Notes (Signed)
 PROVIDER NOTE: Interpretation of information contained herein should be left to medically-trained personnel. Specific patient instructions are provided elsewhere under Patient Instructions section of medical record. This document was created in part using AI and STT-dictation technology, any transcriptional errors that may result from this process are unintentional.  Patient: Jill Nunez  Service: E/M Encounter  Provider: Eric DELENA Como, MD  DOB: 08-12-1965  Delivery: Face-to-face  Specialty: Interventional Pain Management  MRN: [id number]  Setting: Ambulatory outpatient facility  Specialty designation: 09  Type: New Patient  Location: Outpatient office facility  [facility]P: [facility]  DOS: 01/28/2024    Referring Prov.: Trosper, Harlene BIRCH, PA-C   Primary Reason(s) for Visit: Encounter for initial evaluation of one or more chronic problems (new to examiner) potentially causing chronic pain, and posing a threat to normal musculoskeletal function. (Level of risk: High) CC: Back Pain (lower)  HPI  Ms. Jill Nunez is a 58 y.o. year old, female patient, who comes for the first time to our practice referred by Trosper, Jessica D, PA-C for our initial evaluation of her chronic pain. She has Dyspareunia in female; Allergy; Asthma; Atrophic vaginitis; Cervicalgia; Depression; Dermatitis, unspecified; Diarrhea, unspecified; Dry eye syndrome of bilateral lacrimal glands; Essential hypertension; Gastroesophageal reflux disease; H/O gastroesophageal reflux (GERD); H/O hiatal hernia; History of cholecystitis; History of iron deficiency; Insomnia; Iron deficiency anemia; Chronic low back pain (Bilateral) w/o sciatica; Menorrhagia; Benign neoplasm of vulva; Neoplasm of vulva; Nondisplaced fracture of lateral malleolus of left fibula, subsequent encounter for closed fracture with routine healing; Obesity (BMI 30-39.9); Osteoarthritis; Other cicatricial alopecia; Encounter for screening for malignant neoplasm of  colon; Other specified counseling; Routine general medical examination at a health care facility; Screening for malignant neoplasm of cervix; Left knee pain; Pain in left foot; Pain in left hip; Pain in left ankle and joints of left foot; Perineal pain; Primary localized osteoarthrosis of ankle and foot; Sprain of unspecified ligament of left ankle, initial encounter; Swelling of first metatarsophalangeal (MTP) joint; Uterine leiomyoma; Vitamin D  deficiency; Vitreous degeneration, right eye; Allergic rhinitis; Adjustment disorder with mixed emotional features; Acquired hallux valgus; Abnormal uterine bleeding; Abnormal finding on thyroid function test; Polycystic ovaries; Chronic hip pain (Right); Impaired range of motion of hips (Right); Retrolisthesis; Chronic hip pain (Bilateral) (R>L); Chronic pain syndrome; Pharmacologic therapy; Disorder of skeletal system; and Problems influencing health status on their problem list. Today she comes in for evaluation of her Back Pain (lower)  Pain Assessment: Location: Lower Back Radiating: right outer leg to the knee Onset: More than a month ago Duration: Chronic pain Quality: Aching, Throbbing Severity: 10-Worst pain ever/10 (subjective, self-reported pain score)  Effect on ADL: difficulty performing daily activities Timing: Intermittent Modifying factors: rest BP: 130/76  HR: 81  Onset and Duration: Gradual and Present longer than 3 months Cause of pain: sitting Severity: Getting worse, NAS-11 at its worse: 10/10, NAS-11 at its best: 0/10, NAS-11 now: 10/10, and NAS-11 on the average: 10/10 Timing: Not influenced by the time of the day Aggravating Factors: Prolonged sitting and Prolonged standing Alleviating Factors: Hot packs Associated Problems: Pain that wakes patient up Quality of Pain: Aching, Agonizing, Deep, Disabling, Horrible, Pulsating, and Throbbing Previous Examinations or Tests: MRI scan, X-rays, and Orthopedic evaluation Previous  Treatments: The patient denies any previous treatments  Jill Nunez is being evaluated for possible interventional pain management therapies for the treatment of her chronic pain.  Discussed the use of AI scribe software for clinical note transcription with the patient, who gave  verbal consent to proceed.  History of Present Illness   Jill Nunez is a 58 year old female who presents with right hip pain radiating to the knee and persistent lower back pain. She was referred by another provider for evaluation of lower back pain.  She experiences aching pain in the lower back, radiating to the right hip and extending to the knee, located on the lateral aspect of the leg. The pain does not extend below the knee, and there is no numbness or weakness in the lower extremities.  Cortisone injections in the hip and back at the [facility] have not alleviated her symptoms. The pain persists in the lower back, especially when lying down or getting up, often disrupting sleep.  She is undergoing physical therapy at the [facility] for right hip pain, attending sessions once a month. Some therapy manipulations have exacerbated the pain, leading to adjustments in the treatment plan. She has been referred to an outside provider for more frequent sessions.  She has not undergone any surgeries on her hip or back. An MRI of her back was performed, but specific results are not available. There is no history of diabetes or blood sugar problems, and no numbness following the back injection.        Jill Nunez has been informed that this initial visit was an evaluation only.  On the follow up appointment I will go over the results, including ordered tests and available interventional therapies. At that time she will have the opportunity to decide whether to proceed with offered therapies or not. In the event that Jill Nunez prefers avoiding interventional options, this will conclude our involvement in the case.  Medication  management recommendations may be provided upon request.  Patient informed that diagnostic tests may be ordered to assist in identifying underlying causes, narrow the list of differential diagnoses and aid in determining candidacy for (or contraindications to) planned therapeutic interventions.  Historic Controlled Substance Pharmacotherapy Review PMP and historical list of controlled substances: Tramadol  50 mg tablet, 1 tab p.o. daily as needed (20/month) (# 20) (last filled on 12/07/2023); phentermine 37.5 mg tablet, 1 tab p.o. daily (30/month) (last filled on 08/26/2023); lorazepam 1 mg tablet (# 2) (last filled on 06/29/2023); oxycodone  IR 5 mg tablet (# 12) (last filled on 11/15/2022); gabapentin 300 mg capsule (# 2) (last filled on 11/15/2022) Most recently prescribed controlled substance(s): Opioid Analgesic: Tramadol  50 mg tablet, 1 tab p.o. daily as needed (20/month) (# 20) (last filled on 12/07/2023) MME/day: 6.67 mg/day  Historical Monitoring: The patient  reports no history of drug use. List of prior UDS Testing: No results found for: MDMA, COCAINSCRNUR, PCPSCRNUR, PCPQUANT, CANNABQUANT, THCU, ETH, CBDTHCR, D8THCCBX, D9THCCBX Historical Background Evaluation: [place] PMP: PDMP reviewed during this encounter. Review of the past 75-months conducted.             PMP NARX Score Report:  Narcotic: 240 Sedative: 140 Stimulant: 090 [place] Department of public safety, offender search: Engineer, [employer] Information) Non-contributory Risk Assessment Profile: Aberrant behavior: None observed or detected today Risk factors for fatal opioid overdose: None identified today PMP NARX Overdose Risk Score: 310 Fatal overdose hazard ratio (HR): Calculation deferred Non-fatal overdose hazard ratio (HR): Calculation deferred Risk of opioid abuse or dependence: 0.7-3.0% with doses <= 36 MME/day and 6.1-26% with doses >= 120 MME/day. Substance use disorder (SUD) risk level: See below Personal History  of Substance Abuse (SUD-Substance use disorder):  Alcohol: Negative  Illegal Drugs: Negative  Rx Drugs: Negative  ORT  Risk Level calculation: Low Risk  Opioid Risk Tool - 01/28/24 0925       Family History of Substance Abuse   Alcohol Negative    Illegal Drugs Negative    Rx Drugs Negative      Personal History of Substance Abuse   Alcohol Negative    Illegal Drugs Negative    Rx Drugs Negative      Age   Age between 16-45 years  No      History of Preadolescent Sexual Abuse   History of Preadolescent Sexual Abuse Negative or Female      Psychological Disease   Psychological Disease Negative    Depression Negative      Total Score   Opioid Risk Tool Scoring 0    Opioid Risk Interpretation Low Risk         ORT Scoring interpretation table:  Score <3 = Low Risk for SUD  Score between 4-7 = Moderate Risk for SUD  Score >8 = High Risk for Opioid Abuse   PHQ-2 Depression Scale:  Total score: 0  PHQ-2 Scoring interpretation table: (Score and probability of major depressive disorder)  Score 0 = No depression  Score 1 = 15.4% Probability  Score 2 = 21.1% Probability  Score 3 = 38.4% Probability  Score 4 = 45.5% Probability  Score 5 = 56.4% Probability  Score 6 = 78.6% Probability   PHQ-9 Depression Scale:  Total score: 0  PHQ-9 Scoring interpretation table:  Score 0-4 = No depression  Score 5-9 = Mild depression  Score 10-14 = Moderate depression  Score 15-19 = Moderately severe depression  Score 20-27 = Severe depression (2.4 times higher risk of SUD and 2.89 times higher risk of overuse)   Pharmacologic Plan: As per protocol, I have not taken over any controlled substance management, pending the results of ordered tests and/or consults.            Initial impression: Pending review of available data and ordered tests.  Meds   Current Outpatient Medications:    5-Hydroxytryptophan 200 MG CAPS, Take by mouth. Daily as needed, Disp: , Rfl:    acetaminophen   (TYLENOL ) 325 MG tablet, Take 650 mg by mouth 3 (three) times daily as needed for moderate pain or headache., Disp: , Rfl:    amLODipine (NORVASC) 10 MG tablet, Take 10 mg by mouth every evening. , Disp: , Rfl:    buPROPion (WELLBUTRIN XL) 300 MG 24 hr tablet, Take 300 mg by mouth daily., Disp: , Rfl:    clobetasol (TEMOVATE) 0.05 % external solution, Apply 1 application topically daily as needed (irritation)., Disp: , Rfl:    hydrocortisone 2.5 % cream, Apply topically., Disp: , Rfl:    ibuprofen  (ADVIL ) 800 MG tablet, Take 1 tablet (800 mg total) by mouth every 6 (six) hours as needed., Disp: 60 tablet, Rfl: 1   ketoconazole (NIZORAL) 2 % shampoo, Apply topically., Disp: , Rfl:    ketotifen (ZADITOR) 0.025 % ophthalmic solution, Place 1 drop into both eyes 2 (two) times daily., Disp: , Rfl:    olopatadine (PATANOL) 0.1 % ophthalmic solution, Apply to eye., Disp: , Rfl:    predniSONE  (DELTASONE ) 20 MG tablet, Take 3 tablets (60 mg total) by mouth daily with breakfast for 3 days, THEN 2 tablets (40 mg total) daily with breakfast for 3 days, THEN 1 tablet (20 mg total) daily with breakfast for 3 days., Disp: 18 tablet, Rfl: 0   traZODone (DESYREL) 100 MG tablet, Take 100  mg by mouth at bedtime as needed for sleep., Disp: , Rfl:   Imaging Review  Foot Imaging: Foot-R DG Complete: Results for orders placed in visit on 11/11/21 DG Foot Complete Right  Narrative Please see detailed radiograph report in office note.  Foot-L DG Complete: Results for orders placed in visit on 10/02/18 DG Foot Complete Left  Narrative Please see detailed radiograph report in office note.  Complexity Note: Imaging results reviewed.                         ROS  Cardiovascular: High blood pressure and Heart murmur Pulmonary or Respiratory: Snoring  and Coughing up mucus (Bronchitis) Neurological: No reported neurological signs or symptoms such as seizures, abnormal skin sensations, urinary and/or fecal  incontinence, being born with an abnormal open spine and/or a tethered spinal cord Psychological-Psychiatric: Anxiousness Gastrointestinal: Heartburn due to stomach pushing into lungs (Hiatal hernia), Reflux or heatburn, and Alternating episodes iof diarrhea and constipation (IBS-Irritable bowe syndrome) Genitourinary: No reported renal or genitourinary signs or symptoms such as difficulty voiding or producing urine, peeing blood, non-functioning kidney, kidney stones, difficulty emptying the bladder, difficulty controlling the flow of urine, or chronic kidney disease Hematological: Weakness due to low blood hemoglobin or red blood cell count (Anemia) and Abnormal red blood cell shape problems (Sickle Cell disease or trait) Endocrine: No reported endocrine signs or symptoms such as high or low blood sugar, rapid heart rate due to high thyroid levels, obesity or weight gain due to slow thyroid or thyroid disease Rheumatologic: No reported rheumatological signs and symptoms such as fatigue, joint pain, tenderness, swelling, redness, heat, stiffness, decreased range of motion, with or without associated rash Musculoskeletal: Negative for myasthenia gravis, muscular dystrophy, multiple sclerosis or malignant hyperthermia Work History: Working full time  Allergies  Ms. Yinger is allergic to codeine, capsaicin, and trazodone.  Laboratory Chemistry Profile   Renal Lab Results  Component Value Date   BUN 5 (L) 08/08/2018   CREATININE 0.59 08/08/2018   GFRAA >60 08/08/2018   GFRNONAA >60 08/08/2018     Electrolytes Lab Results  Component Value Date   NA 140 08/08/2018   K 3.7 08/08/2018   CL 107 08/08/2018   CALCIUM 9.0 08/08/2018     Hepatic No results found for: AST, ALT, ALBUMIN, ALKPHOS, AMYLASE, LIPASE, AMMONIA   ID Lab Results  Component Value Date   SARSCOV2NAA Not Detected 11/06/2018   PREGTESTUR NEGATIVE 08/08/2018     Bone No results found for: VD25OH,  [id number], CI6874NY7, CI7874NY7, 25OHVITD1, 25OHVITD2, 25OHVITD3, TESTOFREE, TESTOSTERONE   Endocrine Lab Results  Component Value Date   GLUCOSE 81 08/08/2018     Neuropathy No results found for: VITAMINB12, FOLATE, HGBA1C, HIV   CNS No results found for: COLORCSF, APPEARCSF, RBCCOUNTCSF, WBCCSF, POLYSCSF, LYMPHSCSF, EOSCSF, PROTEINCSF, GLUCCSF, JCVIRUS, CSFOLI, IGGCSF, LABACHR, ACETBL   Inflammation (CRP: Acute  ESR: Chronic) No results found for: CRP, ESRSEDRATE, LATICACIDVEN   Rheumatology No results found for: RF, ANA, LABURIC, URICUR, LYMEIGGIGMAB, LYMEABIGMQN, HLAB27   Coagulation Lab Results  Component Value Date   PLT 289 08/08/2018     Cardiovascular Lab Results  Component Value Date   HGB 11.7 (L) 08/08/2018   HCT 36.2 08/08/2018     Screening Lab Results  Component Value Date   SARSCOV2NAA Not Detected 11/06/2018   PREGTESTUR NEGATIVE 08/08/2018     Cancer No results found for: CEA, CA125, LABCA2   Allergens No results found for: ALMOND, APPLE, ASPARAGUS,  AVOCADO, BANANA, BARLEY, BASIL, BAYLEAF, GREENBEAN, LIMABEAN, WHITEBEAN, BEEFIGE, REDBEET, BLUEBERRY, BROCCOLI, CABBAGE, MELON, CARROT, CASEIN, CASHEWNUT, CAULIFLOWER, CELERY     Note: Lab results reviewed.  PFSH  Drug: Ms. Armato  reports no history of drug use. Alcohol:  reports current alcohol use. Tobacco:  reports that she has never smoked. She has never used smokeless tobacco. Medical:  has a past medical history of Anemia, Anxiety, Arthritis, GERD (gastroesophageal reflux disease), Headache, Heart murmur, History of hiatal hernia, Hypertension, Insomnia, Pneumonia, and Wears glasses. Family: family history includes Breast cancer in her paternal aunt and paternal aunt.  Past Surgical History:  Procedure Laterality Date   APPENDECTOMY     BUNIONECTOMY     right    BUNIONECTOMY Left 08/08/2018   Procedure: BUNIONECTOMY;  Surgeon: Janit Thresa HERO, DPM;  Location: [facility] OR;  Service: Podiatry;  Laterality: Left;   CAPSULOTOMY Left 08/08/2018   Procedure: CAPSULOTOMY SECOND AND THIRD LEFT;  Surgeon: Janit Thresa HERO, DPM;  Location: [facility] OR;  Service: Podiatry;  Laterality: Left;   CHOLECYSTECTOMY     FOOT SURGERY     left   HAMMER TOE SURGERY Left 08/08/2018   Procedure: HAMMER TOE CORRECTION SECOND AND THIRD LEFT;  Surgeon: Janit Thresa HERO, DPM;  Location: [facility] OR;  Service: Podiatry;  Laterality: Left;   HERNIA REPAIR     hiatal hernia repair during bastric bypass surgery   IMPLANT OF A SILASTIC METATARSAL PHALANGE JOINT Left 08/08/2018   Procedure: IMPLANT OF A SILASTIC METATARSAL PHALANGE JOINT;  Surgeon: Janit Thresa HERO, DPM;  Location: [facility] OR;  Service: Podiatry;  Laterality: Left;   ROUX-EN-Y GASTRIC BYPASS     hiatal hernia repaired during this produre   uterine ablation     Active Ambulatory Problems    Diagnosis Date Noted   Dyspareunia in female 01/28/2024   Allergy 01/28/2024   Asthma 01/28/2024   Atrophic vaginitis 01/28/2024   Cervicalgia 01/28/2024   Depression 12/27/2015   Dermatitis, unspecified 01/28/2024   Diarrhea, unspecified 01/28/2024   Dry eye syndrome of bilateral lacrimal glands 01/28/2024   Essential hypertension 02/01/2015   Gastroesophageal reflux disease 01/28/2024   H/O gastroesophageal reflux (GERD) 02/01/2015   H/O hiatal hernia 02/01/2015   History of cholecystitis 02/01/2015   History of iron deficiency 12/27/2015   Insomnia 01/28/2024   Iron deficiency anemia 01/28/2024   Chronic low back pain (Bilateral) w/o sciatica 01/28/2024   Menorrhagia 01/28/2024   Benign neoplasm of vulva 01/28/2024   Neoplasm of vulva 01/28/2024   Nondisplaced fracture of lateral malleolus of left fibula, subsequent encounter for closed fracture with routine healing 01/28/2024   Obesity (BMI 30-39.9) 02/01/2015   Osteoarthritis 01/28/2024   Other  cicatricial alopecia 01/28/2024   Encounter for screening for malignant neoplasm of colon 01/28/2024   Other specified counseling 01/28/2024   Routine general medical examination at a health care facility 01/28/2024   Screening for malignant neoplasm of cervix 01/28/2024   Left knee pain 01/28/2024   Pain in left foot 01/28/2024   Pain in left hip 01/28/2024   Pain in left ankle and joints of left foot 01/28/2024   Perineal pain 01/28/2024   Primary localized osteoarthrosis of ankle and foot 01/28/2024   Sprain of unspecified ligament of left ankle, initial encounter 01/28/2024   Swelling of first metatarsophalangeal (MTP) joint 01/28/2024   Uterine leiomyoma 01/28/2024   Vitamin D  deficiency 01/28/2024   Vitreous degeneration, right eye 01/28/2024   Allergic rhinitis 01/28/2024   Adjustment disorder with mixed  emotional features 01/28/2024   Acquired hallux valgus 01/28/2024   Abnormal uterine bleeding 01/28/2024   Abnormal finding on thyroid function test 01/28/2024   Polycystic ovaries 01/28/2024   Chronic hip pain (Right) 01/28/2024   Impaired range of motion of hips (Right) 01/28/2024   Retrolisthesis 01/28/2024   Chronic hip pain (Bilateral) (R>L) 01/28/2024   Chronic pain syndrome 01/28/2024   Pharmacologic therapy 01/28/2024   Disorder of skeletal system 01/28/2024   Problems influencing health status 01/28/2024   Resolved Ambulatory Problems    Diagnosis Date Noted   No Resolved Ambulatory Problems   Past Medical History:  Diagnosis Date   Anemia    Anxiety    Arthritis    GERD (gastroesophageal reflux disease)    Headache    Heart murmur    History of hiatal hernia    Hypertension    Pneumonia    Wears glasses    Constitutional Exam  General appearance: Well nourished, well developed, and well hydrated. In no apparent acute distress Vitals:   01/28/24 0919  BP: 130/76  Pulse: 81  Resp: 16  Temp: (!) 97.2 F (36.2 C)  TempSrc: Temporal  SpO2: 100%   Weight: 140 lb (63.5 kg)  Height: 5' 4 (1.626 m)   BMI Assessment: Estimated body mass index is 24.03 kg/m as calculated from the following:   Height as of this encounter: 5' 4 (1.626 m).   Weight as of this encounter: 140 lb (63.5 kg).  BMI interpretation table: BMI level Category Range association with higher incidence of chronic pain  <18 kg/m2 Underweight   18.5-24.9 kg/m2 Ideal body weight   25-29.9 kg/m2 Overweight Increased incidence by 20%  30-34.9 kg/m2 Obese (Class I) Increased incidence by 68%  35-39.9 kg/m2 Severe obesity (Class II) Increased incidence by 136%  >40 kg/m2 Extreme obesity (Class III) Increased incidence by 254%   Patient's current BMI Ideal Body weight  Body mass index is 24.03 kg/m. Ideal body weight: 54.7 kg (120 lb 9.5 oz) Adjusted ideal body weight: 58.2 kg (128 lb 5.7 oz)   BMI Readings from Last 4 Encounters:  01/28/24 24.03 kg/m  08/08/18 25.23 kg/m   Wt Readings from Last 4 Encounters:  01/28/24 140 lb (63.5 kg)  08/08/18 147 lb (66.7 kg)    Psych/Mental status: Alert, oriented x 3 (person, place, & time)       Eyes: PERLA Respiratory: No evidence of acute respiratory distress Physical Exam   MUSCULOSKELETAL: Right hip pain on flexion and adduction. Left hip with better range of motion compared to right. Right leg with limited range of motion.       Assessment  Primary Diagnosis & Pertinent Problem List: The primary encounter diagnosis was Chronic hip pain (Right). Diagnoses of Impaired range of motion of hips (Right), Chronic low back pain (Bilateral) w/o sciatica, Retrolisthesis, Chronic hip pain (Bilateral) (R>L), Chronic pain syndrome, Pharmacologic therapy, Disorder of skeletal system, and Problems influencing health status were also pertinent to this visit.  Visit Diagnosis (New problems to examiner): 1. Chronic hip pain (Right)   2. Impaired range of motion of hips (Right)   3. Chronic low back pain (Bilateral) w/o sciatica    4. Retrolisthesis   5. Chronic hip pain (Bilateral) (R>L)   6. Chronic pain syndrome   7. Pharmacologic therapy   8. Disorder of skeletal system   9. Problems influencing health status    Plan of Care (Initial workup plan)  Note: Ms. Eisenhuth was reminded that as per  protocol, today's visit has been an evaluation only. We have not taken over the patient's controlled substance management.  Problem-specific plan: Assessment and Plan    Right hip pain radiating to right knee   Chronic right hip pain radiates to the right knee and worsens with certain movements. A previous cortisone injection provided no relief. Physical therapy continues, though some manipulations increase pain. Differential diagnosis includes hip joint pathology. X-rays of both hips are ordered. She will start a prednisone  taper and undergo blood work to check inflammation markers. A follow-up is planned in two weeks to review x-ray results and assess treatment response.  Central lower back pain with stepwise retrolisthesis L3-L5   Chronic central lower back pain is associated with MRI findings of stepwise retrolisthesis from L3 to L5. A previous injection did not alleviate the pain, which remains central and persistent. Differential diagnosis includes spinal instability due to retrolisthesis. An x-ray of the lumbar spine with flexion and extension views is ordered. She will begin an opioid taper for pain management. A follow-up is scheduled in two weeks to review x-ray results and evaluate treatment response.       Lab Orders         Compliance Drug Analysis, Ur         Comp. Metabolic Panel (12)         Magnesium         Vitamin B12         Sedimentation rate         25-Hydroxy vitamin D  Lcms D2+D3         C-reactive protein     Imaging Orders         DG Lumbar Spine Complete W/Bend         DG HIPS BILAT W OR W/O PELVIS MIN 5 VIEWS     Referral Orders  No referral(s) requested today   Procedure Orders    No  procedure(s) ordered today   Pharmacotherapy (current): Medications ordered:  Meds ordered this encounter  Medications   predniSONE  (DELTASONE ) 20 MG tablet    Sig: Take 3 tablets (60 mg total) by mouth daily with breakfast for 3 days, THEN 2 tablets (40 mg total) daily with breakfast for 3 days, THEN 1 tablet (20 mg total) daily with breakfast for 3 days.    Dispense:  18 tablet    Refill:  0   Medications administered during this visit: Reeve R. Weissberg had no medications administered during this visit.   Analgesic Pharmacotherapy:  Opioid Analgesics: For patients currently taking or requesting to take opioid analgesics, in accordance with [place]  Medical Board Guidelines, we will assess their risks and indications for the use of these substances. After completing our evaluation, we may offer recommendations, but we no longer take patients for medication management. The prescribing physician will ultimately decide, based on his/her training and level of comfort whether to adopt any of the recommendations, including whether or not to prescribe such medicines.  Membrane stabilizer: To be determined at a later time  Muscle relaxant: To be determined at a later time  NSAID: To be determined at a later time  Other analgesic(s): To be determined at a later time   Interventional management options: Ms. Strubel was informed that there is no guarantee that she would be a candidate for interventional therapies. The decision will be based on the results of diagnostic studies, as well as Ms. Troiani risk profile.  Procedure(s) under consideration:  Pending results of  ordered studies     Interventional Therapies  Risk Factors  Considerations  Medical Comorbidities:     Planned  Pending:      Under consideration:   Pending   Completed: (Analgesic benefit)1  None at this time   Therapeutic  Palliative (PRN) options:   None established   Completed by other providers:   None  reported  1(Analgesic benefit): Expressed in percentage (%). (Local anesthetic[LA] +/- sedation  L.A.Local Anesthetic  Steroid benefit  Ongoing benefit)   Provider-requested follow-up: Return in about 2 weeks (around 02/11/2024) for ( ), Eval-day (M,W), (Face2F), 2nd Visit, to review of ordered test(s).  Future Appointments  Date Time Provider Department Center  02/25/2024 11:00 AM Tanya Glisson, MD [facility] None    I discussed the assessment and treatment plan with the patient. The patient was provided an opportunity to ask questions and all were answered. The patient agreed with the plan and demonstrated an understanding of the instructions.  Patient advised to call back or seek an in-person evaluation if the symptoms or condition worsens.  Duration of encounter: 175 minutes.  Total time on encounter, as per AMA guidelines included both the face-to-face and non-face-to-face time personally spent by the physician and/or other qualified health care professional(s) on the day of the encounter (includes time in activities that require the physician or other qualified health care professional and does not include time in activities normally performed by clinical staff). Physician's time may include the following activities when performed: Preparing to see the patient (e.g., pre-charting review of records, searching for previously ordered imaging, lab work, and nerve conduction tests) Review of prior analgesic pharmacotherapies. Reviewing PMP Interpreting ordered tests (e.g., lab work, imaging, nerve conduction tests) Performing post-procedure evaluations, including interpretation of diagnostic procedures Obtaining and/or reviewing separately obtained history Performing a medically appropriate examination and/or evaluation Counseling and educating the patient/family/caregiver Ordering medications, tests, or procedures Referring and communicating with other health care professionals  (when not separately reported) Documenting clinical information in the electronic or other health record Independently interpreting results (not separately reported) and communicating results to the patient/ family/caregiver Care coordination (not separately reported)  Note by: Glisson DELENA Tanya, MD (TTS and AI technology used. I apologize for any typographical errors that were not detected and corrected.) Date: 01/28/2024; Time: 11:05 AM

## 2024-01-28 ENCOUNTER — Ambulatory Visit
Admission: RE | Admit: 2024-01-28 | Discharge: 2024-01-28 | Disposition: A | Discharge status: Home / Self Care | Source: Ambulatory Visit | Attending: Pain Medicine | Admitting: Pain Medicine

## 2024-01-28 ENCOUNTER — Ambulatory Visit: Attending: Pain Medicine | Admitting: Pain Medicine

## 2024-01-28 ENCOUNTER — Encounter: Payer: Self-pay | Admitting: Pain Medicine

## 2024-01-28 ENCOUNTER — Ambulatory Visit
Admission: RE | Admit: 2024-01-28 | Discharge: 2024-01-28 | Disposition: A | Discharge status: Home / Self Care | Attending: Pain Medicine | Admitting: Pain Medicine

## 2024-01-28 VITALS — BP 130/76 | HR 81 | Temp 97.2°F | Resp 16 | Ht 64.0 in | Wt 140.0 lb

## 2024-01-28 DIAGNOSIS — M25476 Effusion, unspecified foot: Secondary | ICD-10-CM | POA: Insufficient documentation

## 2024-01-28 DIAGNOSIS — S8265XD Nondisplaced fracture of lateral malleolus of left fibula, subsequent encounter for closed fracture with routine healing: Secondary | ICD-10-CM | POA: Insufficient documentation

## 2024-01-28 DIAGNOSIS — Z124 Encounter for screening for malignant neoplasm of cervix: Secondary | ICD-10-CM | POA: Insufficient documentation

## 2024-01-28 DIAGNOSIS — M79672 Pain in left foot: Secondary | ICD-10-CM | POA: Insufficient documentation

## 2024-01-28 DIAGNOSIS — H43811 Vitreous degeneration, right eye: Secondary | ICD-10-CM | POA: Insufficient documentation

## 2024-01-28 DIAGNOSIS — J45909 Unspecified asthma, uncomplicated: Secondary | ICD-10-CM | POA: Insufficient documentation

## 2024-01-28 DIAGNOSIS — N941 Unspecified dyspareunia: Secondary | ICD-10-CM | POA: Insufficient documentation

## 2024-01-28 DIAGNOSIS — G4700 Insomnia, unspecified: Secondary | ICD-10-CM | POA: Insufficient documentation

## 2024-01-28 DIAGNOSIS — G894 Chronic pain syndrome: Secondary | ICD-10-CM

## 2024-01-28 DIAGNOSIS — J309 Allergic rhinitis, unspecified: Secondary | ICD-10-CM | POA: Insufficient documentation

## 2024-01-28 DIAGNOSIS — Z79899 Other long term (current) drug therapy: Secondary | ICD-10-CM | POA: Insufficient documentation

## 2024-01-28 DIAGNOSIS — M25572 Pain in left ankle and joints of left foot: Secondary | ICD-10-CM | POA: Insufficient documentation

## 2024-01-28 DIAGNOSIS — E559 Vitamin D deficiency, unspecified: Secondary | ICD-10-CM | POA: Insufficient documentation

## 2024-01-28 DIAGNOSIS — F4329 Adjustment disorder with other symptoms: Secondary | ICD-10-CM | POA: Insufficient documentation

## 2024-01-28 DIAGNOSIS — M545 Low back pain, unspecified: Secondary | ICD-10-CM | POA: Diagnosis present

## 2024-01-28 DIAGNOSIS — E282 Polycystic ovarian syndrome: Secondary | ICD-10-CM | POA: Insufficient documentation

## 2024-01-28 DIAGNOSIS — G8929 Other chronic pain: Secondary | ICD-10-CM | POA: Diagnosis present

## 2024-01-28 DIAGNOSIS — D259 Leiomyoma of uterus, unspecified: Secondary | ICD-10-CM | POA: Insufficient documentation

## 2024-01-28 DIAGNOSIS — D4959 Neoplasm of unspecified behavior of other genitourinary organ: Secondary | ICD-10-CM | POA: Insufficient documentation

## 2024-01-28 DIAGNOSIS — M4310 Spondylolisthesis, site unspecified: Secondary | ICD-10-CM | POA: Diagnosis present

## 2024-01-28 DIAGNOSIS — M542 Cervicalgia: Secondary | ICD-10-CM | POA: Insufficient documentation

## 2024-01-28 DIAGNOSIS — R102 Pelvic and perineal pain unspecified side: Secondary | ICD-10-CM | POA: Insufficient documentation

## 2024-01-28 DIAGNOSIS — M25551 Pain in right hip: Secondary | ICD-10-CM | POA: Insufficient documentation

## 2024-01-28 DIAGNOSIS — L309 Dermatitis, unspecified: Secondary | ICD-10-CM | POA: Insufficient documentation

## 2024-01-28 DIAGNOSIS — L6689 Other cicatricial alopecia: Secondary | ICD-10-CM | POA: Insufficient documentation

## 2024-01-28 DIAGNOSIS — M19079 Primary osteoarthritis, unspecified ankle and foot: Secondary | ICD-10-CM | POA: Insufficient documentation

## 2024-01-28 DIAGNOSIS — Z7189 Other specified counseling: Secondary | ICD-10-CM | POA: Insufficient documentation

## 2024-01-28 DIAGNOSIS — M25652 Stiffness of left hip, not elsewhere classified: Secondary | ICD-10-CM | POA: Insufficient documentation

## 2024-01-28 DIAGNOSIS — H04123 Dry eye syndrome of bilateral lacrimal glands: Secondary | ICD-10-CM | POA: Insufficient documentation

## 2024-01-28 DIAGNOSIS — M25562 Pain in left knee: Secondary | ICD-10-CM | POA: Insufficient documentation

## 2024-01-28 DIAGNOSIS — M25552 Pain in left hip: Secondary | ICD-10-CM | POA: Diagnosis not present

## 2024-01-28 DIAGNOSIS — K219 Gastro-esophageal reflux disease without esophagitis: Secondary | ICD-10-CM | POA: Insufficient documentation

## 2024-01-28 DIAGNOSIS — M2010 Hallux valgus (acquired), unspecified foot: Secondary | ICD-10-CM | POA: Insufficient documentation

## 2024-01-28 DIAGNOSIS — Z789 Other specified health status: Secondary | ICD-10-CM | POA: Diagnosis present

## 2024-01-28 DIAGNOSIS — R197 Diarrhea, unspecified: Secondary | ICD-10-CM | POA: Insufficient documentation

## 2024-01-28 DIAGNOSIS — D509 Iron deficiency anemia, unspecified: Secondary | ICD-10-CM | POA: Insufficient documentation

## 2024-01-28 DIAGNOSIS — S93402A Sprain of unspecified ligament of left ankle, initial encounter: Secondary | ICD-10-CM | POA: Insufficient documentation

## 2024-01-28 DIAGNOSIS — M899 Disorder of bone, unspecified: Secondary | ICD-10-CM | POA: Diagnosis present

## 2024-01-28 DIAGNOSIS — Z0000 Encounter for general adult medical examination without abnormal findings: Secondary | ICD-10-CM | POA: Insufficient documentation

## 2024-01-28 DIAGNOSIS — M25651 Stiffness of right hip, not elsewhere classified: Secondary | ICD-10-CM

## 2024-01-28 DIAGNOSIS — D280 Benign neoplasm of vulva: Secondary | ICD-10-CM | POA: Insufficient documentation

## 2024-01-28 DIAGNOSIS — T7840XA Allergy, unspecified, initial encounter: Secondary | ICD-10-CM | POA: Insufficient documentation

## 2024-01-28 DIAGNOSIS — K591 Functional diarrhea: Secondary | ICD-10-CM | POA: Insufficient documentation

## 2024-01-28 DIAGNOSIS — N920 Excessive and frequent menstruation with regular cycle: Secondary | ICD-10-CM | POA: Insufficient documentation

## 2024-01-28 DIAGNOSIS — N939 Abnormal uterine and vaginal bleeding, unspecified: Secondary | ICD-10-CM | POA: Insufficient documentation

## 2024-01-28 DIAGNOSIS — M1990 Unspecified osteoarthritis, unspecified site: Secondary | ICD-10-CM | POA: Insufficient documentation

## 2024-01-28 DIAGNOSIS — Z1211 Encounter for screening for malignant neoplasm of colon: Secondary | ICD-10-CM | POA: Insufficient documentation

## 2024-01-28 DIAGNOSIS — R946 Abnormal results of thyroid function studies: Secondary | ICD-10-CM | POA: Insufficient documentation

## 2024-01-28 DIAGNOSIS — N952 Postmenopausal atrophic vaginitis: Secondary | ICD-10-CM | POA: Insufficient documentation

## 2024-01-28 MED ORDER — PREDNISONE 20 MG PO TABS: ORAL_TABLET | ORAL | 0 refills | Status: AC

## 2024-01-28 NOTE — Patient Instructions (Signed)

## 2024-02-02 LAB — COMPLIANCE DRUG ANALYSIS, UR

## 2024-02-05 LAB — COMP. METABOLIC PANEL (12)
AST: 27 IU/L (ref 0–40)
Albumin: 4.5 g/dL (ref 3.8–4.9)
Alkaline Phosphatase: 71 IU/L (ref 49–135)
BUN/Creatinine Ratio: 6 — ABNORMAL LOW (ref 9–23)
BUN: 5 mg/dL — ABNORMAL LOW (ref 6–24)
Bilirubin Total: 0.3 mg/dL (ref 0.0–1.2)
Calcium: 9.3 mg/dL (ref 8.7–10.2)
Chloride: 103 mmol/L (ref 96–106)
Creatinine, Ser: 0.77 mg/dL (ref 0.57–1.00)
Globulin, Total: 2.3 g/dL (ref 1.5–4.5)
Glucose: 75 mg/dL (ref 70–99)
Potassium: 3.7 mmol/L (ref 3.5–5.2)
Sodium: 143 mmol/L (ref 134–144)
Total Protein: 6.8 g/dL (ref 6.0–8.5)
eGFR: 89 mL/min/1.73 (ref >59)

## 2024-02-05 LAB — C-REACTIVE PROTEIN: CRP: <1 mg/L (ref 0–10)

## 2024-02-05 LAB — 25-HYDROXY VITAMIN D LCMS D2+D3
25-Hydroxy, Vitamin D-2: <1 ng/mL
25-Hydroxy, Vitamin D-3: 62 ng/mL
25-Hydroxy, Vitamin D: 63 ng/mL

## 2024-02-05 LAB — MAGNESIUM: Magnesium: 2 mg/dL (ref 1.6–2.3)

## 2024-02-05 LAB — VITAMIN B12: Vitamin B-12: >2000 pg/mL — ABNORMAL HIGH (ref 232–1245)

## 2024-02-05 LAB — SEDIMENTATION RATE: Sed Rate: 6 mm/h (ref 0–40)

## 2024-02-24 DIAGNOSIS — L441 Lichen nitidus: Secondary | ICD-10-CM | POA: Insufficient documentation

## 2024-02-24 DIAGNOSIS — L299 Pruritus, unspecified: Secondary | ICD-10-CM | POA: Insufficient documentation

## 2024-02-25 ENCOUNTER — Encounter: Payer: Self-pay | Admitting: Pain Medicine

## 2024-02-25 ENCOUNTER — Ambulatory Visit: Attending: Pain Medicine | Admitting: Pain Medicine

## 2024-02-25 VITALS — BP 128/69 | HR 91 | Temp 97.3°F | Resp 16 | Ht 64.0 in | Wt 140.0 lb

## 2024-02-25 DIAGNOSIS — M4310 Spondylolisthesis, site unspecified: Secondary | ICD-10-CM | POA: Insufficient documentation

## 2024-02-25 DIAGNOSIS — R937 Abnormal findings on diagnostic imaging of other parts of musculoskeletal system: Secondary | ICD-10-CM | POA: Insufficient documentation

## 2024-02-25 DIAGNOSIS — M25551 Pain in right hip: Secondary | ICD-10-CM | POA: Insufficient documentation

## 2024-02-25 DIAGNOSIS — M150 Primary generalized (osteo)arthritis: Secondary | ICD-10-CM | POA: Diagnosis not present

## 2024-02-25 DIAGNOSIS — G8929 Other chronic pain: Secondary | ICD-10-CM | POA: Diagnosis present

## 2024-02-25 DIAGNOSIS — M5136 Other intervertebral disc degeneration, lumbar region with discogenic back pain only: Secondary | ICD-10-CM | POA: Insufficient documentation

## 2024-02-25 DIAGNOSIS — M19072 Primary osteoarthritis, left ankle and foot: Secondary | ICD-10-CM | POA: Insufficient documentation

## 2024-02-25 DIAGNOSIS — M47816 Spondylosis without myelopathy or radiculopathy, lumbar region: Secondary | ICD-10-CM | POA: Insufficient documentation

## 2024-02-25 DIAGNOSIS — M4316 Spondylolisthesis, lumbar region: Secondary | ICD-10-CM | POA: Diagnosis not present

## 2024-02-25 DIAGNOSIS — M51369 Other intervertebral disc degeneration, lumbar region without mention of lumbar back pain or lower extremity pain: Secondary | ICD-10-CM | POA: Insufficient documentation

## 2024-02-25 DIAGNOSIS — M79604 Pain in right leg: Secondary | ICD-10-CM | POA: Diagnosis not present

## 2024-02-25 DIAGNOSIS — M545 Low back pain, unspecified: Secondary | ICD-10-CM | POA: Diagnosis present

## 2024-02-25 DIAGNOSIS — M5451 Vertebrogenic low back pain: Secondary | ICD-10-CM | POA: Insufficient documentation

## 2024-02-25 DIAGNOSIS — M5459 Other low back pain: Secondary | ICD-10-CM | POA: Insufficient documentation

## 2024-02-25 NOTE — Progress Notes (Signed)
 PROVIDER NOTE: Interpretation of information contained herein should be left to medically-trained personnel. Specific patient instructions are provided elsewhere under Patient Instructions section of medical record. This document was created in part using AI and STT-dictation technology, any transcriptional errors that may result from this process are unintentional.  Patient: Jill Nunez  Service: E/M   PCP: [facility]  DOB: Jan 28, 1966  DOS: 02/25/2024  Provider: Eric DELENA Como, MD  MRN: [id number]  Delivery: Face-to-face  Specialty: Interventional Pain Management  Type: Established Patient  Setting: Ambulatory outpatient facility  Specialty designation: 09  Referring Prov.: [facility]  Location: Outpatient office facility       Primary Reason(s) for Visit: Encounter for evaluation before starting new chronic pain management plan of care (Level of risk: moderate) CC: Back Pain, Hip Pain (Bilateral R>L), and Leg Pain (R>L)  HPI  Jill Nunez is a 58 y.o. year old, female patient, who comes today for a follow-up evaluation to review the test results and decide on a treatment plan. She has Dyspareunia in female; Allergy; Asthma; Atrophic vaginitis; Cervicalgia; Depression; Dermatitis, unspecified; Functional diarrhea; Dry eye syndrome of bilateral lacrimal glands; Essential hypertension; Gastroesophageal reflux disease; H/O gastroesophageal reflux (GERD); H/O hiatal hernia; History of cholecystitis; History of iron deficiency; Insomnia; Iron deficiency anemia; Chronic low back pain (1ry area of Pain) (Bilateral) w/o sciatica; Menorrhagia; Benign neoplasm of vulva; Neoplasm of vulva; Nondisplaced fracture of lateral malleolus of left fibula, subsequent encounter for closed fracture with routine healing; Obesity (BMI 30-39.9); Other cicatricial alopecia; Encounter for screening for malignant neoplasm of colon; Other specified counseling; Routine general medical examination  at a health care facility; Screening for malignant neoplasm of cervix; Chronic knee pain (Left); Chronic foot pain (Left); Chronic ankle and joints of foot pain (Left); Perineal pain; Sprain of unspecified ligament of left ankle, initial encounter; Swelling of first metatarsophalangeal (MTP) joint; Uterine leiomyoma; Vitamin D  deficiency; Vitreous degeneration, right eye; Allergic rhinitis; Adjustment disorder with mixed emotional features; Acquired hallux valgus; Abnormal uterine bleeding; Abnormal finding on thyroid function test; Polycystic ovaries; Chronic hip pain (2ry area of Pain) (Right); Impaired range of motion of hips (Right); Grade 1 lumbosacral Retrolisthesis (L3-4, L4-5) (3 mm L5-S1); Chronic hip pain (Bilateral) (R>L); Chronic pain syndrome; Pharmacologic therapy; Disorder of skeletal system; Problems influencing health status; Lichen nitidus; Pruritus, unspecified; DDD (degenerative disc disease), lumbar; Chronic hip pain (Left); Osteoarthritis involving multiple joints; Osteoarthritis of ankle and foot (Left); Lumbar intervertebral disc space narrowing (L3-4, L4-5, L5-S1); Lumbar facet arthropathy  (Bilateral: L4-5, L5-S1); Abnormal MRI, lumbar spine [facility]) (08/06/2023); Low back pain of over 3 months duration; Multifactorial low back pain; Vertebrogenic low back pain; Discogenic low back pain; Low back pain radiating to leg (Right); Low back pain potentially associated with radiculopathy (Right); and Lumbar facet joint pain on their problem list. Her primarily concern today is the Back Pain, Hip Pain (Bilateral R>L), and Leg Pain (R>L)  Pain Assessment: Location: Lower, Right, Left Back Radiating: From lower back into hips bilateral to outer thigh to knees bilateral (R>L) Onset: More than a month ago Duration: Chronic pain Quality: Aching, Throbbing, Pressure, Nagging, Discomfort Severity: 10-Worst pain ever/10 (subjective, self-reported pain score)  Effect on ADL: Limits ADLs. Hard  to sit or drive over 20 mins or unable to stand too long Timing: Intermittent Modifying factors: Heating pad BP: 128/69  HR: 91  Ms. Skousen comes in today for a follow-up visit after her initial evaluation on 01/28/2024. Today we went over  the results of her tests. These were explained in Layman's terms. During today's appointment we went over my diagnostic impression, as well as the proposed treatment plan.  Review of initial evaluation (01/28/2024): Jill Nunez is a 58 year old female who presents with right hip pain radiating to the knee and persistent lower back pain. She was referred by another provider for evaluation of lower back pain.   She experiences aching pain in the lower back, radiating to the right hip and extending to the knee, located on the lateral aspect of the leg. The pain does not extend below the knee, and there is no numbness or weakness in the lower extremities.   Cortisone injections in the hip and back at the [place] have not alleviated her symptoms. The pain persists in the lower back, especially when lying down or getting up, often disrupting sleep.   She is undergoing physical therapy at the [facility] for right hip pain, attending sessions once a month. Some therapy manipulations have exacerbated the pain, leading to adjustments in the treatment plan. She has been referred to an outside provider for more frequent sessions.   She has not undergone any surgeries on her hip or back. An MRI of her back was performed, but specific results are not available. There is no history of diabetes or blood sugar problems, and no numbness following the back injection.  Review of diagnostic test ordered on 01/28/2024:  Diagnostic lab work: Vitamin D  levels, C-reactive protein, sed rate, and magnesium levels were all within normal limits.  UDS was negative for unexpected illicit substances.  Comprehensive metabolic panel demonstrated a decreased BUN of 5 mg/dL (normal 3-75) as well  as a decreased BUN/creatinine ratio of 6 (normal 9-23).  Elevated vitamin B 12 levels of more than 2000 pg/mL (normal [phone]). Diagnostic imaging: Diagnostic x-rays of both hips demonstrated no acute osseous abnormalities of the hips.  Diagnostic x-rays of the lumbar spine with bending views demonstrated degenerative changes in the lower lumbar spine with no acute evidence of fractures or malalignment.  There is bilateral facet arthropathy at L4-5 and L5-S1.  There is also moderate intervertebral disc space narrowing at L3-4, L4-5, and L5-S1 compatible with degenerative disc disease.   Prior LUMBAR MRI [facility]) FINDINGS: Lumbar levoscoliosis.  Trace stepwise retrolisthesis from L3-L5.  3 mm retrolisthesis of L5/S1.  Multilevel degenerative disc disease.  Modic type I degenerative changes at T11-12.  Modic type II degenerative changes at L3-4, L4-5, and L5-S1.  DISC LEVELS: T10-11: Posterior disc bulge. T11-12: Posterior disc bulge.  Bilateral neural foraminal stenosis. T12-L1: Normal L1-2: Normal L2-3: Facet arthrosis. L3-4: Right facet arthrosis.  Posterior disc osteophyte complex.  Mild spinal canal stenosis.  Mild right greater than left neuroforaminal stenosis.  Right lateral recess stenosis.  Small left foraminal annular fissure. L4-5: Advanced right and mild left facet arthrosis.  Small posterior disc osteophyte complex.  Moderate spinal canal stenosis.  Moderate to severe right and mild left neuroforaminal stenosis. L5-S1: Moderate left facet arthrosis.  Small posterior disc osteophyte complex.  Moderate to severe left and mild right neuroforaminal stenosis.  IMPRESSION: 1.  Multilevel degenerative changes of the lumbar spine with moderate spinal canal stenosis at L4-5. 2.  Multilevel neural foraminal stenosis which are moderate to severe on the right at the L4-5 and on the left at L5-S1.  Discussed the use of AI scribe software for clinical note transcription with the patient, who  gave verbal consent to proceed.  History of  Present Illness   SHERIANN NEWMANN is a 58 year old female who presents with right hip pain radiating to the knee and persistent low back pain.  She has aching low back pain that radiates through the right hip down the lateral leg to the knee.  She has received cortisone injections to the hip and back at the [facility] hospital and is in physical therapy about once a month at [facility] for right hip pain.  Recent labs were overall unremarkable without inflammatory abnormalities. A comprehensive metabolic panel showed only mild BUN and BUN/creatinine ratio reductions without other significant findings.  Hip x-rays showed no acute abnormality. Lumbar spine x-rays showed degenerative changes with bilateral facet arthropathy at L4/5 and L5/S1 and moderate disc space narrowing at L3/4, L4/5, and L5/S1.  Lumbar MRI showed multilevel degenerative disc disease with moderate spinal canal stenosis at L4/5 and multilevel neuroforaminal stenosis, moderate to severe on the right at L4/5 and on the left at L5/S1.      Patient presented with interventional treatment options. Ms. Vogl was informed that I will not be providing medication management. Pharmacotherapy evaluation including recommendations may be offered, if specifically requested.   Controlled Substance Pharmacotherapy Assessment REMS (Risk Evaluation and Mitigation Strategy)  Opioid Analgesic: Tramadol  50 mg tablet, 1 tab p.o. daily as needed (20/month) (# 20) (last filled on 12/07/2023) MME/day: 6.67 mg/day   Pill Count: None expected due to no prior prescriptions written by our practice. No notes on file  Pharmacokinetics: Liberation and absorption (onset of action): WNL Distribution (time to peak effect): WNL Metabolism and excretion (duration of action): WNL         Pharmacodynamics: Desired effects: Analgesia: Ms. Stoney reports >50% benefit. Functional ability: Patient reports that medication  allows her to accomplish basic ADLs Clinically meaningful improvement in function (CMIF): Sustained CMIF goals met Perceived effectiveness: Described as relatively effective, allowing for increase in activities of daily living (ADL) Undesirable effects: Side-effects or Adverse reactions: None reported Monitoring: [place] PMP: PDMP reviewed during this encounter. Online review of the past 84-month period previously conducted. Not applicable at this point since we have not taken over the patient's medication management yet. List of other Serum/Urine Drug Screening Test(s):  No results found for: AMPHSCRSER, BARBSCRSER, BENZOSCRSER, COCAINSCRSER, COCAINSCRNUR, PCPSCRSER, THCSCRSER, THCU, CANNABQUANT, OPIATESCRSER, OXYSCRSER, PROPOXSCRSER, ETH, CBDTHCR, D8THCCBX, D9THCCBX List of all UDS test(s) done:  Lab Results  Component Value Date   SUMMARY FINAL 01/28/2024   Last UDS on record: Summary  Date Value Ref Range Status  01/28/2024 FINAL  Final    Comment:    ==================================================================== Compliance Drug Analysis, Ur ==================================================================== Test                             Result       Flag       Units  Drug Present and Declared for Prescription Verification   Bupropion                      PRESENT      EXPECTED   Hydroxybupropion               PRESENT      EXPECTED    Hydroxybupropion is an expected metabolite of bupropion.  Drug Present not Declared for Prescription Verification   N-Desmethyltramadol            532          UNEXPECTED  ng/mg creat    N-desmethyltramadol is an expected metabolite of tramadol . Source of    tramadol  is a prescription medication.  Drug Absent but Declared for Prescription Verification   Trazodone                      Not Detected UNEXPECTED   Acetaminophen                   Not Detected UNEXPECTED    Acetaminophen , as indicated in the declared  medication list, is not    always detected even when used as directed.    Ibuprofen                       Not Detected UNEXPECTED    Ibuprofen , as indicated in the declared medication list, is not    always detected even when used as directed.  ==================================================================== Test                      Result    Flag   Units      Ref Range   Creatinine              348              mg/dL      >=79 ==================================================================== Declared Medications:  The flagging and interpretation on this report are based on the  following declared medications.  Unexpected results may arise from  inaccuracies in the declared medications.   **Note: The testing scope of this panel includes these medications:   Bupropion (Wellbutrin)  Trazodone   **Note: The testing scope of this panel does not include small to  moderate amounts of these reported medications:   Acetaminophen   Ibuprofen    **Note: The testing scope of this panel does not include the  following reported medications:   Amlodipine (Norvasc)  Clobetasol  Hydrocortisone  Ketoconazole (Nizoral)  Ketotifen (Zaditor)  Olopatadine  Supplement  Topical ==================================================================== For clinical consultation, please call [phone]. ====================================================================    UDS interpretation: No unexpected findings.          Medication Assessment Form: Not applicable. No opioids. Treatment compliance: Not applicable Risk Assessment Profile: Aberrant behavior: See initial evaluations. None observed or detected today Comorbid factors increasing risk of overdose: See initial evaluation. No additional risks detected today Opioid risk tool (ORT):     01/28/2024    9:25 AM  Opioid Risk   Alcohol 0  Illegal Drugs 0  Rx Drugs 0  Alcohol 0  Illegal Drugs 0  Rx Drugs 0  Age between 16-45  years  0  History of Preadolescent Sexual Abuse 0  Psychological Disease 0  Depression 0  Opioid Risk Tool Scoring 0  Opioid Risk Interpretation Low Risk    ORT Scoring interpretation table:  Score <3 = Low Risk for SUD  Score between 4-7 = Moderate Risk for SUD  Score >8 = High Risk for Opioid Abuse   Risk of substance use disorder (SUD): Low  Risk Mitigation Strategies:  Patient opioid safety counseling: No controlled substances prescribed. Patient-Prescriber Agreement (PPA): No agreement signed.  Controlled substance notification to other providers: None required. No opioid therapy.  Pharmacologic Plan: Non-opioid analgesic therapy offered. Interventional alternatives discussed.             Laboratory Chemistry Profile   Renal Lab Results  Component Value Date   BUN 5 (L) 01/28/2024   CREATININE 0.77 01/28/2024  BCR 6 (L) 01/28/2024   GFRAA >60 08/08/2018   GFRNONAA >60 08/08/2018     Electrolytes Lab Results  Component Value Date   NA 143 01/28/2024   K 3.7 01/28/2024   CL 103 01/28/2024   CALCIUM 9.3 01/28/2024   MG 2.0 01/28/2024     Hepatic Lab Results  Component Value Date   AST 27 01/28/2024   ALBUMIN 4.5 01/28/2024   ALKPHOS 71 01/28/2024     ID Lab Results  Component Value Date   SARSCOV2NAA Not Detected 11/06/2018   PREGTESTUR NEGATIVE 08/08/2018     Bone Lab Results  Component Value Date   25OHVITD1 63 01/28/2024   25OHVITD2 <1.0 01/28/2024   25OHVITD3 62 01/28/2024     Endocrine Lab Results  Component Value Date   GLUCOSE 75 01/28/2024     Neuropathy Lab Results  Component Value Date   VITAMINB12 >2000 (H) 01/28/2024     CNS No results found for: COLORCSF, APPEARCSF, RBCCOUNTCSF, WBCCSF, POLYSCSF, LYMPHSCSF, EOSCSF, PROTEINCSF, GLUCCSF, JCVIRUS, CSFOLI, IGGCSF, LABACHR, ACETBL   Inflammation (CRP: Acute  ESR: Chronic) Lab Results  Component Value Date   CRP <1 01/28/2024   ESRSEDRATE 6  01/28/2024     Rheumatology No results found for: RF, ANA, LABURIC, URICUR, LYMEIGGIGMAB, LYMEABIGMQN, HLAB27   Coagulation Lab Results  Component Value Date   PLT 289 08/08/2018     Cardiovascular Lab Results  Component Value Date   HGB 11.7 (L) 08/08/2018   HCT 36.2 08/08/2018     Screening Lab Results  Component Value Date   SARSCOV2NAA Not Detected 11/06/2018   PREGTESTUR NEGATIVE 08/08/2018     Cancer No results found for: CEA, CA125, LABCA2   Allergens No results found for: ALMOND, APPLE, ASPARAGUS, AVOCADO, BANANA, BARLEY, BASIL, BAYLEAF, GREENBEAN, LIMABEAN, WHITEBEAN, BEEFIGE, REDBEET, BLUEBERRY, BROCCOLI, CABBAGE, MELON, CARROT, CASEIN, CASHEWNUT, CAULIFLOWER, CELERY     Note: Lab results reviewed.  Recent Diagnostic Imaging Review  Lumbosacral Imaging: Lumbar DG Bending views: Results for orders placed during the hospital encounter of 01/28/24 DG Lumbar Spine Complete W/Bend  Narrative CLINICAL DATA:  Low back pain  EXAM: LUMBAR SPINE - COMPLETE WITH BENDING VIEWS  COMPARISON:  None Available.  FINDINGS: There is no evidence of lumbar spine fracture. Alignment is normal with flexion and extension. There is bilateral facet arthropathy at L4-L5 and L5-S1. There is moderate disc space narrowing at L3-L4, L4-L5 and L5-S1 compatible with degenerative change. There surgical clips in the abdomen.  IMPRESSION: Moderate degenerative changes in the lower lumbar spine. No acute fracture or malalignment.   Electronically Signed By: Greig Pique M.D. On: 02/01/2024 23:40  Hip Imaging: Hip-B DG Bilateral (5V): Results for orders placed during the hospital encounter of 01/28/24 DG HIPS BILAT W OR W/O PELVIS MIN 5 VIEWS  Narrative EXAM: 5 OR MORE VIEW(S) XRAY OF THE BILATERAL HIP 01/28/2024 11:44:00 AM  COMPARISON: None available.  CLINICAL HISTORY: Chronic bilateral hip pain (M25.551,  M25.552, G89.29)  FINDINGS:  BONES AND JOINTS: No acute fracture or focal osseous lesion. The hip joint is maintained.  LUMBAR SPINE: Degenerative changes in visualized lower lumbar spine. Levoconvex lumbar scoliosis.  SOFT TISSUES: Surgical sutures in left mid abdomen.  IMPRESSION: 1. No acute osseous abnormality of the hips.  Electronically signed by: Donnice Mania MD 02/01/2024 10:50 PM EST RP Workstation: [id number]  Foot Imaging: Foot-R DG Complete: Results for orders placed in visit on 11/11/21 DG Foot Complete Right  Narrative Please see detailed radiograph report in office note.  Foot-L DG Complete: Results for orders placed in visit on 10/02/18 DG Foot Complete Left  Narrative Please see detailed radiograph report in office note.  Complexity Note: Imaging results reviewed.                         Meds  Current Medications[1]  ROS  Constitutional: Denies any fever or chills Gastrointestinal: No reported hemesis, hematochezia, vomiting, or acute GI distress Musculoskeletal: Denies any acute onset joint swelling, redness, loss of ROM, or weakness Neurological: No reported episodes of acute onset apraxia, aphasia, dysarthria, agnosia, amnesia, paralysis, loss of coordination, or loss of consciousness  Allergies  Ms. Wolgamott is allergic to codeine, capsaicin, and trazodone.  PFSH  Drug: Ms. Zachar  reports no history of drug use. Alcohol:  reports current alcohol use. Tobacco:  reports that she has never smoked. She has never used smokeless tobacco. Medical:  has a past medical history of Anemia, Anxiety, Arthritis, GERD (gastroesophageal reflux disease), Headache, Heart murmur, History of hiatal hernia, Hypertension, Insomnia, Pneumonia, and Wears glasses. Surgical: Ms. Cocke  has a past surgical history that includes Cholecystectomy; uterine ablation; Appendectomy; Bunionectomy; Roux-en-Y Gastric Bypass; Hernia repair; Foot surgery; Bunionectomy (Left,  08/08/2018); Hammer toe surgery (Left, 08/08/2018); Capsulotomy (Left, 08/08/2018); and Implant of a silastic metatarsal phalange joint (Left, 08/08/2018). Family: family history includes Breast cancer in her paternal aunt and paternal aunt.  Constitutional Exam  General appearance: Well nourished, well developed, and well hydrated. In no apparent acute distress Vitals:   02/25/24 1104  BP: 128/69  Pulse: 91  Resp: 16  Temp: (!) 97.3 F (36.3 C)  TempSrc: Temporal  SpO2: 99%  Weight: 140 lb (63.5 kg)  Height: 5' 4 (1.626 m)   BMI Assessment: Estimated body mass index is 24.03 kg/m as calculated from the following:   Height as of this encounter: 5' 4 (1.626 m).   Weight as of this encounter: 140 lb (63.5 kg).  BMI interpretation table: BMI level Category Range association with higher incidence of chronic pain  <18 kg/m2 Underweight   18.5-24.9 kg/m2 Ideal body weight   25-29.9 kg/m2 Overweight Increased incidence by 20%  30-34.9 kg/m2 Obese (Class I) Increased incidence by 68%  35-39.9 kg/m2 Severe obesity (Class II) Increased incidence by 136%  >40 kg/m2 Extreme obesity (Class III) Increased incidence by 254%   Patient's current BMI Ideal Body weight  Body mass index is 24.03 kg/m. Ideal body weight: 54.7 kg (120 lb 9.5 oz) Adjusted ideal body weight: 58.2 kg (128 lb 5.7 oz)   BMI Readings from Last 4 Encounters:  02/25/24 24.03 kg/m  01/28/24 24.03 kg/m  08/08/18 25.23 kg/m   Wt Readings from Last 4 Encounters:  02/25/24 140 lb (63.5 kg)  01/28/24 140 lb (63.5 kg)  08/08/18 147 lb (66.7 kg)    Psych/Mental status: Alert, oriented x 3 (person, place, & time)       Eyes: PERLA Respiratory: No evidence of acute respiratory distress  Assessment & Plan  Primary Diagnosis & Pertinent Problem List: The primary encounter diagnosis was Chronic low back pain (1ry area of Pain) (Bilateral) w/o sciatica. Diagnoses of Chronic hip pain (2ry area of Pain) (Right), Degeneration of  intervertebral disc of lumbar region, unspecified whether pain present, Osteoarthritis involving multiple joints, Lumbar intervertebral disc space narrowing (L3-4, L4-5, L5-S1), Lumbar facet arthropathy  (Bilateral: L4-5, L5-S1), Low back pain of over 3 months duration, Multifactorial low back pain, Discogenic low back pain, Low back pain radiating  to leg (Right), Low back pain potentially associated with radiculopathy (Right), Lumbar facet joint pain, Grade 1 lumbosacral Retrolisthesis (L3-4, L4-5) (3 mm L5-S1), Vertebrogenic low back pain, and Abnormal MRI, lumbar spine [facility]) (08/06/2023) were also pertinent to this visit. Visit Diagnosis: 1. Chronic low back pain (1ry area of Pain) (Bilateral) w/o sciatica   2. Chronic hip pain (2ry area of Pain) (Right)   3. Degeneration of intervertebral disc of lumbar region, unspecified whether pain present   4. Osteoarthritis involving multiple joints   5. Lumbar intervertebral disc space narrowing (L3-4, L4-5, L5-S1)   6. Lumbar facet arthropathy  (Bilateral: L4-5, L5-S1)   7. Low back pain of over 3 months duration   8. Multifactorial low back pain   9. Discogenic low back pain   10. Low back pain radiating to leg (Right)   11. Low back pain potentially associated with radiculopathy (Right)   12. Lumbar facet joint pain   13. Grade 1 lumbosacral Retrolisthesis (L3-4, L4-5) (3 mm L5-S1)   14. Vertebrogenic low back pain   15. Abnormal MRI, lumbar spine [facility]) (08/06/2023)    Problems updated and reviewed during this visit: Problem  Ddd (Degenerative Disc Disease), Lumbar  Chronic hip pain (Left)  Osteoarthritis involving multiple joints  Osteoarthritis of ankle and foot (Left)  Lumbar intervertebral disc space narrowing (L3-4, L4-5, L5-S1)  Lumbar facet arthropathy  (Bilateral: L4-5, L5-S1)  Abnormal MRI, lumbar spine [facility]) (08/06/2023)   LUMBAR MRI [facility] FINDINGS: Lumbar levoscoliosis.  Trace stepwise  retrolisthesis from L3-L5.  3 mm retrolisthesis of L5/S1.  Multilevel degenerative disc disease.  Modic type I degenerative changes at T11-12.  Modic type II degenerative changes at L3-4, L4-5, and L5-S1.  DISC LEVELS: T10-11: Posterior disc bulge. T11-12: Posterior disc bulge.  Bilateral neural foraminal stenosis. T12-L1: Normal L1-2: Normal L2-3: Facet arthrosis. L3-4: Right facet arthrosis.  Posterior disc osteophyte complex.  Mild spinal canal stenosis.  Mild right greater than left neuroforaminal stenosis.  Right lateral recess stenosis.  Small left foraminal annular fissure. L4-5: Advanced right and mild left facet arthrosis.  Small posterior disc osteophyte complex.  Moderate spinal canal stenosis.  Moderate to severe right and mild left neuroforaminal stenosis. L5-S1: Moderate left facet arthrosis.  Small posterior disc osteophyte complex.  Moderate to severe left and mild right neuroforaminal stenosis.  IMPRESSION: 1.  Multilevel degenerative changes of the lumbar spine with moderate spinal canal stenosis at L4-5. 2.  Multilevel neural foraminal stenosis which are moderate to severe on the right at the L4-5 and on the left at L5-S1.   Low Back Pain of Over 3 Months Duration  Multifactorial Low Back Pain  Vertebrogenic Low Back Pain   Modic type I degenerative changes at T11-12.  Modic type II degenerative changes at L3-4, L4-5, and L5-S1.   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 Pain radiates from the lower back to the right hip and knee, likely due to right L4/5 neuroforaminal stenosis. Imaging shows moderate intervertebral disc space narrowing at L3-4, L4-5, and L5-S1, with no acute malalignment or fractures. Conservative management with epidural steroid injections is preferred over surgical intervention due to the non-severe nature of the stenosis. A central epidural steroid injection is scheduled for January 15th. Consider repeat injections every two weeks if the initial injection does not provide complete relief. An after-visit summary with procedure details was provided. Ensure she has a driver for the procedure if sedation is used.  Elevated vitamin B12 level   Vitamin B12 level is elevated over 2000 picograms/mL, likely due to excessive supplementation. Lab work shows no evidence of renal issues. Elevated B12 is not associated with neuropathy in this case. She is advised to stop vitamin B12 supplementation for 1-2 weeks, then resume every other day. Follow-up with a primary care physician is recommended for further evaluation.        Pharmacotherapy (Medications Ordered): No orders of the defined types were placed in this encounter.  Procedure Orders         Lumbar Epidural Injection     Orders Placed This Encounter  Procedures   Lumbar Epidural Injection    Standing Status:   Future    Expiration Date:   05/25/2024    Scheduling Instructions:     Procedure: Interlaminar Lumbar Epidural Steroid injection (LESI)  L4-5     Laterality: Right-sided     Sedation: With Sedation.     Timeframe: As soon as schedule allows.    Where will this procedure be performed?:   [facility]   Lab Orders  No laboratory test(s)  ordered today   Imaging Orders  No imaging studies ordered today   Referral Orders  No referral(s) requested today    Pharmacological management:  Opioid Analgesics: I will not be prescribing any opioids at this time Membrane stabilizer: I will not be prescribing any at this time Muscle relaxant: I will not be prescribing any at this time NSAID: I will not be prescribing any at this time Other analgesic(s): I will not be prescribing any at this time      Interventional Therapies  Risk Factors  Considerations  Medical Comorbidities:     Planned  Pending:   Therapeutic right L4-5 interlaminar LESI #1  Therapeutic right L4-5 TFESI #1    Under consideration:   Therapeutic right L4-5 interlaminar LESI #1  Therapeutic right L4-5 TFESI #1  Diagnostic/therapeutic right vs bilateral lumbar facet MBB #1    Completed: (Analgesic benefit)1  None at this time   Therapeutic  Palliative (PRN) options:   None established   Completed by other providers:   None reported  1(Analgesic benefit): Expressed in percentage (%). (Local anesthetic[LA] +/- sedation  L.A.Local Anesthetic  Steroid benefit  Ongoing benefit)      Provider-requested follow-up: Return in about 24 days (around 03/20/2024) for (ECT): (R) L4-5 LESI #1. Recent Visits Date Type Provider Dept  01/28/24 Office Visit Tanya Glisson, MD [facility]  Showing recent visits within past 90 days and meeting all other requirements Today's Visits Date Type Provider Dept  02/25/24 Office Visit Tanya Glisson, MD [facility]  Showing today's visits and meeting all other requirements Future Appointments No visits were found meeting these conditions. Showing  future appointments within next 90 days and meeting all other requirements   Primary Care Physician: [facility]  Duration of encounter: 67 minutes.  Total time on encounter, as per AMA guidelines included both the  face-to-face and non-face-to-face time personally spent by the physician and/or other qualified health care professional(s) on the day of the encounter (includes time in activities that require the physician or other qualified health care professional and does not include time in activities normally performed by clinical staff). Physician's time may include the following activities when performed: Preparing to see the patient (e.g., pre-charting review of records, searching for previously ordered imaging, lab work, and nerve conduction tests) Review of prior analgesic pharmacotherapies. Reviewing PMP Interpreting ordered tests (e.g., lab work, imaging, nerve conduction tests) Performing post-procedure evaluations, including interpretation of diagnostic procedures Obtaining and/or reviewing separately obtained history Performing a medically appropriate examination and/or evaluation Counseling and educating the patient/family/caregiver Ordering medications, tests, or procedures Referring and communicating with other health care professionals (when not separately reported) Documenting clinical information in the electronic or other health record Independently interpreting results (not separately reported) and communicating results to the patient/ family/caregiver Care coordination (not separately reported)  Note by: Eric DELENA Como, MD (TTS technology used. I apologize for any typographical errors that were not detected and corrected.) Date: 02/25/2024; Time: 11:31 AM     [1]  Current Outpatient Medications:    5-Hydroxytryptophan 200 MG CAPS, Take by mouth. Daily as needed, Disp: , Rfl:    acetaminophen  (TYLENOL ) 325 MG tablet, Take 650 mg by mouth 3 (three) times daily as needed for moderate pain or headache., Disp: , Rfl:    amLODipine (NORVASC) 10 MG tablet, Take 10 mg by mouth every evening. , Disp: , Rfl:    buPROPion (WELLBUTRIN XL) 300 MG 24 hr tablet, Take 300 mg by mouth daily.,  Disp: , Rfl:    clobetasol (TEMOVATE) 0.05 % external solution, Apply 1 application topically daily as needed (irritation)., Disp: , Rfl:    hydrocortisone 2.5 % cream, Apply topically., Disp: , Rfl:    ibuprofen  (ADVIL ) 800 MG tablet, Take 1 tablet (800 mg total) by mouth every 6 (six) hours as needed., Disp: 60 tablet, Rfl: 1   ketoconazole (NIZORAL) 2 % shampoo, Apply topically., Disp: , Rfl:    ketotifen (ZADITOR) 0.025 % ophthalmic solution, Place 1 drop into both eyes 2 (two) times daily., Disp: , Rfl:    olopatadine (PATANOL) 0.1 % ophthalmic solution, Apply to eye., Disp: , Rfl:    traZODone (DESYREL) 100 MG tablet, Take 100 mg by mouth at bedtime as needed for sleep., Disp: , Rfl:

## 2024-02-25 NOTE — Patient Instructions (Signed)
 " ______________________________________________________________________    Procedure instructions  Stop blood-thinners  Do not eat or drink fluids (other than water ) for 8 hours before your procedure  No water  for 2 hours before your procedure  Take your blood pressure medicine with a sip of water   Arrive 30 minutes before your appointment  If sedation is planned, bring suitable driver. [place], [place], & public transportation are NOT APPROVED)  Carefully read the Preparing for your procedure detailed instructions  If you have questions call us  at [phone]  Procedure appointments are for procedures only.   NO medication refills or new problem evaluations will be done on procedure days.   Only the scheduled, pre-approved procedure and side will be done.   ______________________________________________________________________     ______________________________________________________________________    Preparing for your procedure  Appointments: If you think you may not be able to keep your appointment, call 24-48 hours in advance to cancel. We need time to make it available to others.  Procedure visits are for procedures only. During your procedure appointment there will be: NO Prescription Refills*. NO medication changes or discussions*. NO discussion of disability issues*. NO unrelated pain problem evaluations*. NO evaluations to order other pain procedures*. *These will be addressed at a separate and distinct evaluation encounter on the provider's evaluation schedule and not during procedure days.  Instructions: Food intake: Avoid eating anything solid for at least 8 hours prior to your procedure. Clear liquid intake: You may take clear liquids such as water  up to 2 hours prior to your procedure. (No carbonated drinks. No soda.) Transportation: Unless otherwise stated by your physician, bring a driver. (Driver cannot be a Market Researcher, Pharmacist, Community, or any other form of public  transportation.) Morning Medicines: Except for blood thinners, take all of your other morning medications with a sip of water . Make sure to take your heart and blood pressure medicines. If your blood pressure's lower number is above 100, the case will be rescheduled. Blood thinners: Make sure to stop your blood thinners as instructed.  If you take a blood thinner, but were not instructed to stop it, call our office [phone] and ask to talk to a nurse. Not stopping a blood thinner prior to certain procedures could lead to serious complications. Diabetics on insulin: Notify the staff so that you can be scheduled 1st case in the morning. If your diabetes requires high dose insulin, take only  of your normal insulin dose the morning of the procedure and notify the staff that you have done so. Preventing infections: Shower with an antibacterial soap the morning of your procedure.  Build-up your immune system: Take 1000 mg of Vitamin C with every meal (3 times a day) the day prior to your procedure. Antibiotics: Inform the nursing staff if you are taking any antibiotics or if you have any conditions that may require antibiotics prior to procedures. (Example: recent joint implants)   Pregnancy: If you are pregnant make sure to notify the nursing staff. Not doing so may result in injury to the fetus, including death.  Sickness: If you have a cold, fever, or any active infections, call and cancel or reschedule your procedure. Receiving steroids while having an infection may result in complications. Arrival: You must be in the facility at least 30 minutes prior to your scheduled procedure. Tardiness: Your scheduled time is also the cutoff time. If you do not arrive at least 15 minutes prior to your procedure, you will be rescheduled.  Children: Do not bring any children  with you. Make arrangements to keep them home. Dress appropriately: There is always a possibility that your clothing may get soiled. Avoid  long dresses. Valuables: Do not bring any jewelry or valuables.  Reasons to call and reschedule or cancel your procedure: (Following these recommendations will minimize the risk of a serious complication.) Surgeries: Avoid having procedures within 2 weeks of any surgery. (Avoid for 2 weeks before or after any surgery). Flu Shots: Avoid having procedures within 2 weeks of a flu shots or . (Avoid for 2 weeks before or after immunizations). Barium: Avoid having a procedure within 7-10 days after having had a radiological study involving the use of radiological contrast. (Myelograms, Barium swallow or enema study). Heart attacks: Avoid any elective procedures or surgeries for the initial 6 months after a Myocardial Infarction (Heart Attack). Blood thinners: It is imperative that you stop these medications before procedures. Let us  know if you if you take any blood thinner.  Infection: Avoid procedures during or within two weeks of an infection (including chest colds or gastrointestinal problems). Symptoms associated with infections include: Localized redness, fever, chills, night sweats or profuse sweating, burning sensation when voiding, cough, congestion, stuffiness, runny nose, sore throat, diarrhea, nausea, vomiting, cold or Flu symptoms, recent or current infections. It is specially important if the infection is over the area that we intend to treat. Heart and lung problems: Symptoms that may suggest an active cardiopulmonary problem include: cough, chest pain, breathing difficulties or shortness of breath, dizziness, ankle swelling, uncontrolled high or unusually low blood pressure, and/or palpitations. If you are experiencing any of these symptoms, cancel your procedure and contact your primary care physician for an evaluation.  Remember:  Regular Business hours are:  Monday to Thursday 8:00 AM to 4:00 PM  Provider's Schedule: Eric Como, MD:  Procedure days: Tuesday and Thursday 7:30  AM to 4:00 PM  Wallie Sherry, MD:  Procedure days: Monday and Wednesday 7:30 AM to 4:00 PM Last  Updated: 02/13/2023 ______________________________________________________________________     ______________________________________________________________________    General Risks and Possible Complications  Patient Responsibilities: It is important that you read this as it is part of your informed consent. It is our duty to inform you of the risks and possible complications associated with treatments offered to you. It is your responsibility as a patient to read this and to ask questions about anything that is not clear or that you believe was not covered in this document.  Patients Rights: You have the right to refuse treatment. You also have the right to change your mind, even after initially having agreed to have the treatment done. However, under this last option, if you wait until the last second to change your mind, you may be charged for the materials used up to that point.  Introduction: Medicine is not an visual merchandiser. Everything in Medicine, including the lack of treatment(s), carries the potential for danger, harm, or loss (which is by definition: Risk). In Medicine, a complication is a secondary problem, condition, or disease that can aggravate an already existing one. All treatments carry the risk of possible complications. The fact that a side effects or complications occurs, does not imply that the treatment was conducted incorrectly. It must be clearly understood that these can happen even when everything is done following the highest safety standards.  No treatment: You can choose not to proceed with the proposed treatment alternative. The PRO(s) would include: avoiding the risk of complications associated with the therapy. The CON(s) would  include: not getting any of the treatment benefits. These benefits fall under one of three categories: diagnostic; therapeutic; and/or  palliative. Diagnostic benefits include: getting information which can ultimately lead to improvement of the disease or symptom(s). Therapeutic benefits are those associated with the successful treatment of the disease. Finally, palliative benefits are those related to the decrease of the primary symptoms, without necessarily curing the condition (example: decreasing the pain from a flare-up of a chronic condition, such as incurable terminal cancer).  General Risks and Complications: These are associated to most interventional treatments. They can occur alone, or in combination. They fall under one of the following six (6) categories: no benefit or worsening of symptoms; bleeding; infection; nerve damage; allergic reactions; and/or death. No benefits or worsening of symptoms: In Medicine there are no guarantees, only probabilities. No healthcare provider can ever guarantee that a medical treatment will work, they can only state the probability that it may. Furthermore, there is always the possibility that the condition may worsen, either directly, or indirectly, as a consequence of the treatment. Bleeding: This is more common if the patient is taking a blood thinner, either prescription or over the counter (example: Goody Powders, Fish oil, Aspirin , Garlic, etc.), or if suffering a condition associated with impaired coagulation (example: Hemophilia, cirrhosis of the liver, low platelet counts, etc.). However, even if you do not have one on these, it can still happen. If you have any of these conditions, or take one of these drugs, make sure to notify your treating physician. Infection: This is more common in patients with a compromised immune system, either due to disease (example: diabetes, cancer, human immunodeficiency virus [HIV], etc.), or due to medications or treatments (example: therapies used to treat cancer and rheumatological diseases). However, even if you do not have one on these, it can still  happen. If you have any of these conditions, or take one of these drugs, make sure to notify your treating physician. Nerve Damage: This is more common when the treatment is an invasive one, but it can also happen with the use of medications, such as those used in the treatment of cancer. The damage can occur to small secondary nerves, or to large primary ones, such as those in the spinal cord and brain. This damage may be temporary or permanent and it may lead to impairments that can range from temporary numbness to permanent paralysis and/or brain death. Allergic Reactions: Any time a substance or material comes in contact with our body, there is the possibility of an allergic reaction. These can range from a mild skin rash (contact dermatitis) to a severe systemic reaction (anaphylactic reaction), which can result in death. Death: In general, any medical intervention can result in death, most of the time due to an unforeseen complication. ______________________________________________________________________      ______________________________________________________________________    Steroid injections  Common steroids for injections Triamcinolone : Used by many sports medicine physicians for large joint and bursal injections, often combined with a local anesthetic like lidocaine . A study focusing on coccydynia (tailbone pain) found triamcinolone  was more effective than betamethasone, suggesting it may also be preferable for other localized inflammation conditions. Methylprednisolone: A common alternative to triamcinolone  that is also a strong anti-inflammatory. It is available in different formulations, with the acetate suspension being the long-acting option for intra-articular injections. Dexamethasone : This is a non-particulate steroid, meaning it has a lower risk of tissue damage compared to particulate steroids like triamcinolone  and methylprednisolone. While less common for this specific  use, it is an option for targeted injections.   Considerations for physicians Particulate vs. non-particulate steroids: Triamcinolone  and methylprednisolone are particulate, meaning they can clump together. Dexamethasone  is non-particulate. Particulate steroids are often preferred for their longer-lasting effects but carry a theoretical higher risk for certain injections (though this is less of a concern in the costochondral joints). Combined injectate: Corticosteroids are typically mixed with a local anesthetic like lidocaine  to provide both immediate pain relief (from the anesthetic) and longer-term inflammation reduction (from the steroid). Imaging guidance: To ensure accurate placement of the needle and medication, physicians may use ultrasound or fluoroscopic guidance for the injection, especially in complex or refractory cases.   Patient guidance Before undergoing a steroid injection, discuss the options with your physician. They will determine the best steroid, dosage, and procedure for your specific case based on factors like: Severity of your condition History of response to other treatments Your overall health status Experience and preference of the physician  Last  Updated: 10/30/2023 ______________________________________________________________________     "

## 2024-03-25 ENCOUNTER — Ambulatory Visit
Admission: RE | Admit: 2024-03-25 | Discharge: 2024-03-25 | Disposition: A | Discharge status: Home / Self Care | Source: Ambulatory Visit | Attending: Pain Medicine | Admitting: Pain Medicine

## 2024-03-25 ENCOUNTER — Encounter: Payer: Self-pay | Admitting: Pain Medicine

## 2024-03-25 ENCOUNTER — Ambulatory Visit: Admitting: Pain Medicine

## 2024-03-25 VITALS — BP 137/86 | Temp 97.0°F | Resp 18 | Ht 64.0 in | Wt 137.0 lb

## 2024-03-25 DIAGNOSIS — M5441 Lumbago with sciatica, right side: Secondary | ICD-10-CM | POA: Insufficient documentation

## 2024-03-25 DIAGNOSIS — G8929 Other chronic pain: Secondary | ICD-10-CM | POA: Insufficient documentation

## 2024-03-25 DIAGNOSIS — M4310 Spondylolisthesis, site unspecified: Secondary | ICD-10-CM | POA: Diagnosis present

## 2024-03-25 DIAGNOSIS — M79604 Pain in right leg: Secondary | ICD-10-CM | POA: Diagnosis present

## 2024-03-25 DIAGNOSIS — L439 Lichen planus, unspecified: Secondary | ICD-10-CM | POA: Insufficient documentation

## 2024-03-25 DIAGNOSIS — M545 Low back pain, unspecified: Secondary | ICD-10-CM

## 2024-03-25 DIAGNOSIS — R937 Abnormal findings on diagnostic imaging of other parts of musculoskeletal system: Secondary | ICD-10-CM | POA: Diagnosis present

## 2024-03-25 DIAGNOSIS — M5136 Other intervertebral disc degeneration, lumbar region with discogenic back pain only: Secondary | ICD-10-CM | POA: Insufficient documentation

## 2024-03-25 DIAGNOSIS — M25551 Pain in right hip: Secondary | ICD-10-CM | POA: Insufficient documentation

## 2024-03-25 DIAGNOSIS — M51361 Other intervertebral disc degeneration, lumbar region with lower extremity pain only: Secondary | ICD-10-CM | POA: Diagnosis present

## 2024-03-25 DIAGNOSIS — M5451 Vertebrogenic low back pain: Secondary | ICD-10-CM

## 2024-03-25 MED ORDER — FENTANYL CITRATE (PF) 100 MCG/2ML IJ SOLN
INTRAMUSCULAR | Status: AC
  Filled 2024-03-25: qty 2

## 2024-03-25 MED ORDER — TRIAMCINOLONE ACETONIDE 40 MG/ML IJ SUSP
INTRAMUSCULAR | Status: AC
  Filled 2024-03-25: qty 1

## 2024-03-25 MED ORDER — ROPIVACAINE HCL 2 MG/ML IJ SOLN
INTRAMUSCULAR | Status: AC
  Filled 2024-03-25: qty 20

## 2024-03-25 MED ORDER — SODIUM CHLORIDE 0.9% FLUSH
2.0000 mL | Freq: Once | INTRAVENOUS | Status: AC
  Administered 2024-03-25: 2 mL

## 2024-03-25 MED ORDER — ROPIVACAINE HCL 2 MG/ML IJ SOLN
2.0000 mL | Freq: Once | INTRAMUSCULAR | Status: AC
  Administered 2024-03-25: 2 mL via EPIDURAL

## 2024-03-25 MED ORDER — IOHEXOL 180 MG/ML  SOLN
10.0000 mL | Freq: Once | INTRAMUSCULAR | Status: AC
  Administered 2024-03-25: 10 mL via EPIDURAL

## 2024-03-25 MED ORDER — LIDOCAINE HCL 2 % IJ SOLN
INTRAMUSCULAR | Status: AC
  Filled 2024-03-25: qty 20

## 2024-03-25 MED ORDER — MIDAZOLAM HCL 5 MG/5ML IJ SOLN
INTRAMUSCULAR | Status: AC
  Filled 2024-03-25: qty 5

## 2024-03-25 MED ORDER — FENTANYL CITRATE (PF) 100 MCG/2ML IJ SOLN: 25.0000 ug | INTRAMUSCULAR | Status: DC | PRN

## 2024-03-25 MED ORDER — PENTAFLUOROPROP-TETRAFLUOROETH EX AERO
INHALATION_SPRAY | Freq: Once | CUTANEOUS | Status: AC
  Administered 2024-03-25: 30 via TOPICAL

## 2024-03-25 MED ORDER — TRIAMCINOLONE ACETONIDE 40 MG/ML IJ SUSP
40.0000 mg | Freq: Once | INTRAMUSCULAR | Status: AC
  Administered 2024-03-25: 40 mg

## 2024-03-25 MED ORDER — IOHEXOL 180 MG/ML  SOLN
INTRAMUSCULAR | Status: AC
  Filled 2024-03-25: qty 10

## 2024-03-25 MED ORDER — LIDOCAINE HCL 2 % IJ SOLN
20.0000 mL | Freq: Once | INTRAMUSCULAR | Status: AC
  Administered 2024-03-25: 400 mg

## 2024-03-25 MED ORDER — SODIUM CHLORIDE (PF) 0.9 % IJ SOLN
INTRAMUSCULAR | Status: AC
  Filled 2024-03-25: qty 10

## 2024-03-25 MED ORDER — MIDAZOLAM HCL 5 MG/5ML IJ SOLN
0.5000 mg | Freq: Once | INTRAMUSCULAR | Status: AC
  Administered 2024-03-25: 3 mg via INTRAVENOUS

## 2024-03-25 NOTE — Progress Notes (Signed)
 PROVIDER NOTE: Interpretation of information contained herein should be left to medically-trained personnel. Specific patient instructions are provided elsewhere under Patient Instructions section of medical record. This document was created in part using STT-dictation technology, any transcriptional errors that may result from this process are unintentional.  Patient: Jill Nunez Type: Established DOB: February 07, 1966 MRN: [id number] PCP: [facility]  Service: Procedure DOS: 03/25/2024 Setting: Ambulatory Location: Ambulatory outpatient facility Delivery: Face-to-face Provider: Eric DELENA Como, MD Specialty: Interventional Pain Management Specialty designation: 09 Location: Outpatient facility Ref. Prov.: [facility]       Interventional Therapy   Type: Lumbar epidural steroid injection (LESI) (interlaminar) #1    Laterality: Right   Level:  L4-5 Level.  Imaging: Fluoroscopic guidance Spinal ([id number]) Anesthesia: Local anesthesia (1-2% Lidocaine ) Anxiolysis: IV Versed   3.0 mg           Analgesia: No Sedation None required. No Fentanyl  administered.         DOS: 03/25/2024  Performed by: Eric DELENA Como, MD  Purpose: Diagnostic/Therapeutic Indications: Lumbar radicular pain of intraspinal etiology of more than 4 weeks that has failed to respond to conservative therapy and is severe enough to impact quality of life or function. 1. 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 Low back pain potentially associated with radiculopathy (Right)   3. Low back pain radiating to leg (Right)   4. Grade 1 lumbosacral Retrolisthesis (L3-4, L4-5) (3 mm L5-S1)   5. Low back pain of over 3 months duration   6. Chronic lower extremity pain (Right)   7. Chronic hip pain (2ry area of Pain) (Right)   8. Discogenic low back pain   9. Degeneration of intervertebral disc of lumbar region with lower extremity pain   10. Vertebrogenic low back pain   11.  Multifactorial low back pain   12. Abnormal MRI, lumbar spine [facility]) (08/06/2023)    NAS-11 Pain score:   Pre-procedure: 10-Worst pain ever/10   Post-procedure: 0-No pain/10      Position / Prep / Materials:  Position: Prone w/ head of the table raised (slight reverse trendelenburg) to facilitate breathing.  Prep solution: ChloraPrep (2% chlorhexidine  gluconate and 70% isopropyl alcohol) Prep Area: Entire Posterior Lumbar Region from lower scapular tip down to mid buttocks area and from flank to flank. Materials:  Tray: Epidural tray Needle(s):  Type: Epidural needle (Tuohy) Gauge (G):  17 Length: Regular (3.5-in) Qty: 1  H&P (Pre-op Assessment):  Ms. Bonello is a 59 y.o. (year old), female patient, seen today for interventional treatment. She  has a past surgical history that includes Cholecystectomy; uterine ablation; Appendectomy; Bunionectomy; Roux-en-Y Gastric Bypass; Hernia repair; Foot surgery; Bunionectomy (Left, 08/08/2018); Hammer toe surgery (Left, 08/08/2018); Capsulotomy (Left, 08/08/2018); and Implant of a silastic metatarsal phalange joint (Left, 08/08/2018). Ms. Courser has a current medication list which includes the following prescription(s): 5-hydroxytryptophan, acetaminophen , amlodipine, bupropion, clobetasol, hydrocortisone, hydroxychloroquine, hydroxyzine, ibuprofen , ketoconazole, ketotifen, olopatadine, prednisone , and trazodone, and the following Facility-Administered Medications: fentanyl . Her primarily concern today is the Back Pain (lower)  Initial Vital Signs:  Pulse/HCG Rate:  ECG Heart Rate: 93 Temp: (!) 97 F (36.1 C) Resp: 16 BP: (!) 140/80 SpO2: 99 %  BMI: Estimated body mass index is 23.52 kg/m as calculated from the following:   Height as of this encounter: 5' 4 (1.626 m).   Weight as of this encounter: 137 lb (62.1 kg).  Risk Assessment: Allergies: Reviewed. She is allergic to codeine, capsaicin, and  trazodone.  Allergy Precautions: None  required Coagulopathies: Reviewed. None identified.  Blood-thinner therapy: None at this time Active Infection(s): Reviewed. None identified. Ms. Yazdani is afebrile  Site Confirmation: Ms. Quevedo was asked to confirm the procedure and laterality before marking the site Procedure checklist: Completed Consent: Before the procedure and under the influence of no sedative(s), amnesic(s), or anxiolytics, the patient was informed of the treatment options, risks and possible complications. To fulfill our ethical and legal obligations, as recommended by the American Medical Association's Code of Ethics, I have informed the patient of my clinical impression; the nature and purpose of the treatment or procedure; the risks, benefits, and possible complications of the intervention; the alternatives, including doing nothing; the risk(s) and benefit(s) of the alternative treatment(s) or procedure(s); and the risk(s) and benefit(s) of doing nothing. The patient was provided information about the general risks and possible complications associated with the procedure. These may include, but are not limited to: failure to achieve desired goals, infection, bleeding, organ or nerve damage, allergic reactions, paralysis, and death. In addition, the patient was informed of those risks and complications associated to Spine-related procedures, such as failure to decrease pain; infection (i.e.: Meningitis, epidural or intraspinal abscess); bleeding (i.e.: epidural hematoma, subarachnoid hemorrhage, or any other type of intraspinal or peri-dural bleeding); organ or nerve damage (i.e.: Any type of peripheral nerve, nerve root, or spinal cord injury) with subsequent damage to sensory, motor, and/or autonomic systems, resulting in permanent pain, numbness, and/or weakness of one or several areas of the body; allergic reactions; (i.e.: anaphylactic reaction); and/or death. Furthermore, the patient was informed of those risks and  complications associated with the medications. These include, but are not limited to: allergic reactions (i.e.: anaphylactic or anaphylactoid reaction(s)); adrenal axis suppression; blood sugar elevation that in diabetics may result in ketoacidosis or comma; water retention that in patients with history of congestive heart failure may result in shortness of breath, pulmonary edema, and decompensation with resultant heart failure; weight gain; swelling or edema; medication-induced neural toxicity; particulate matter embolism and blood vessel occlusion with resultant organ, and/or nervous system infarction; and/or aseptic necrosis of one or more joints. Finally, the patient was informed that Medicine is not an exact science; therefore, there is also the possibility of unforeseen or unpredictable risks and/or possible complications that may result in a catastrophic outcome. The patient indicated having understood very clearly. We have given the patient no guarantees and we have made no promises. Enough time was given to the patient to ask questions, all of which were answered to the patient's satisfaction. Ms. Winkowski has indicated that she wanted to continue with the procedure. Attestation: I, the ordering provider, attest that I have discussed with the patient the benefits, risks, side-effects, alternatives, likelihood of achieving goals, and potential problems during recovery for the procedure that I have provided informed consent. Date  Time: 03/25/2024  8:56 AM  Pre-Procedure Preparation:  Monitoring: As per clinic protocol. Respiration, ETCO2, SpO2, BP, heart rate and rhythm monitor placed and checked for adequate function Safety Precautions: Patient was assessed for positional comfort and pressure points before starting the procedure. Time-out: I initiated and conducted the Time-out before starting the procedure, as per protocol. The patient was asked to participate by confirming the accuracy of the  Time Out information. Verification of the correct person, site, and procedure were performed and confirmed by me, the nursing staff, and the patient. Time-out conducted as per Joint Commission's Universal Protocol (UP.01.01.01). Time: 0952 Start Time: 0953 hrs.  Description/Narrative of Procedure:          Target: Epidural space via interlaminar opening, initially targeting the lower laminar border of the superior vertebral body. Region: Lumbar Approach: Percutaneous paravertebral  Rationale (medical necessity): procedure needed and proper for the diagnosis and/or treatment of the patient's medical symptoms and needs. Procedural Technique Safety Precautions: Aspiration looking for blood return was conducted prior to all injections. At no point did we inject any substances, as a needle was being advanced. No attempts were made at seeking any paresthesias. Safe injection practices and needle disposal techniques used. Medications properly checked for expiration dates. SDV (single dose vial) medications used. Description of the Procedure: Protocol guidelines were followed. The procedure needle was introduced through the skin, ipsilateral to the reported pain, and advanced to the target area. Bone was contacted and the needle walked caudad, until the lamina was cleared. The epidural space was identified using loss-of-resistance technique with 2-3 ml of PF-NaCl (0.9% NSS), in a 5cc LOR glass syringe.  Vitals:   03/25/24 0950 03/25/24 0955 03/25/24 1000 03/25/24 1003  BP: (!) 150/104 (!) 140/97 (!) 144/94 (!) 141/92  Resp: 20 20 19 18   Temp:      TempSrc:      SpO2: 100% 100% 100% 100%  Weight:      Height:        Start Time: 0953 hrs. End Time: 1002 hrs.  Imaging Guidance (Spinal):          Type of Imaging Technique: Fluoroscopy Guidance (Spinal) Indication(s): Fluoroscopy guidance for needle placement to enhance accuracy in procedures requiring precise needle localization for targeted  delivery of medication in or near specific anatomical locations not easily accessible without such real-time imaging assistance. Exposure Time: Please see nurses notes. Contrast: Before injecting any contrast, we confirmed that the patient did not have an allergy to iodine, shellfish, or radiological contrast. Once satisfactory needle placement was completed at the desired level, radiological contrast was injected. Contrast injected under live fluoroscopy. No contrast complications. See chart for type and volume of contrast used. Fluoroscopic Guidance: I was personally present during the use of fluoroscopy. Tunnel Vision Technique used to obtain the best possible view of the target area. Parallax error corrected before commencing the procedure. Direction-depth-direction technique used to introduce the needle under continuous pulsed fluoroscopy. Once target was reached, antero-posterior, oblique, and lateral fluoroscopic projection used confirm needle placement in all planes. Images permanently stored in EMR. Interpretation: I personally interpreted the imaging intraoperatively. Adequate needle placement confirmed in multiple planes. Appropriate spread of contrast into desired area was observed. No evidence of afferent or efferent intravascular uptake. No intrathecal or subarachnoid spread observed. Permanent images saved into the patient's record.  Antibiotic Prophylaxis:   Anti-infectives (From admission, onward)    None      Indication(s): None identified  Post-operative Assessment:  Post-procedure Vital Signs:  Pulse/HCG Rate:  90 Temp: (!) 97 F (36.1 C) Resp: 18 BP: (!) 141/92 SpO2: 100 %  EBL: None  Complications: No immediate post-treatment complications observed by team, or reported by patient.  Note: The patient tolerated the entire procedure well. A repeat set of vitals were taken after the procedure and the patient was kept under observation following institutional policy,  for this type of procedure. Post-procedural neurological assessment was performed, showing return to baseline, prior to discharge. The patient was provided with post-procedure discharge instructions, including a section on how to identify potential problems. Should any problems arise concerning this procedure, the patient was  given instructions to immediately contact us , at any time, without hesitation. In any case, we plan to contact the patient by telephone for a follow-up status report regarding this interventional procedure.  Comments:  No additional relevant information.  Plan of Care (POC)  Orders:  Orders Placed This Encounter  Procedures   Lumbar Epidural Injection    Scheduling Instructions:     Procedure: Interlaminar LESI L4-5     Laterality: Right     Sedation: With Sedation     Date: 03/25/2024    Where will this procedure be performed?:   [facility]   DG [facility] C-ARM 1-60 MIN NO REPORT    Intraoperative interpretation by procedural physician at [facility].    Standing Status:   Standing    Number of Occurrences:   1    Reason for exam::   Assistance in needle guidance and placement for procedures requiring needle placement in or near specific anatomical locations not easily accessible without such assistance.   Informed Consent Details: Physician/Practitioner Attestation; Transcribe to consent form and obtain patient signature    Note: Always confirm laterality of pain with Ms. Christy, before procedure. Transcribe to consent form and obtain patient signature.    Physician/Practitioner attestation of informed consent for procedure/surgical case:   I, the physician/practitioner, attest that I have discussed with the patient the benefits, risks, side effects, alternatives, likelihood of achieving goals and potential problems during recovery for the procedure that I have provided informed consent.    Procedure:   Lumbar epidural steroid injection under  fluoroscopic guidance    Physician/Practitioner performing the procedure:   Aneira Cavitt A. Tanya, MD    Indication/Reason:   Low back and/or lower extremity pain secondary to lumbar radiculitis   Provide equipment / supplies at bedside    Procedural tray: Epidural Tray (Disposable  single use) Skin infiltration needle: Regular 1.5-in, 25-G, (x1) Block needle size: Regular standard Catheter: No catheter required    Standing Status:   Standing    Number of Occurrences:   1    Specify:   Epidural Tray   Saline lock IV    Have LR [phone] mL available and administer at 125 mL/hr if patient becomes hypotensive.    Standing Status:   Standing    Number of Occurrences:   1     Opioid Analgesic: Tramadol  50 mg tablet, 1 tab p.o. daily as needed (20/month) (# 20) (last filled on 12/07/2023) MME/day: 6.67 mg/day    Medications ordered for procedure: Meds ordered this encounter  Medications   iohexol  (OMNIPAQUE ) 180 MG/ML injection 10 mL    Must be Myelogram-compatible. If not available, you may substitute with a water-soluble, non-ionic, hypoallergenic, myelogram-compatible radiological contrast medium.   lidocaine  (XYLOCAINE ) 2 % (with pres) injection 400 mg   pentafluoroprop-tetrafluoroeth (GEBAUERS) aerosol   midazolam  (VERSED ) 5 MG/5ML injection 0.5-2 mg    Make sure Flumazenil is available in the pyxis when using this medication. If oversedation occurs, administer 0.2 mg IV over 15 sec. If after 45 sec no response, administer 0.2 mg again over 1 min; may repeat at 1 min intervals; not to exceed 4 doses (1 mg)   fentaNYL  (SUBLIMAZE ) injection 25-50 mcg    Make sure Narcan is available in the pyxis when using this medication. In the event of respiratory depression (RR< 8/min): Titrate NARCAN (naloxone) in increments of 0.1 to 0.2 mg IV at 2-3 minute intervals, until desired degree of reversal.   sodium chloride  flush (  NS) 0.9 % injection 2 mL   ropivacaine  (PF) 2 mg/mL (0.2%) (NAROPIN )  injection 2 mL   triamcinolone  acetonide (KENALOG -40) injection 40 mg   Medications administered: We administered iohexol , lidocaine , pentafluoroprop-tetrafluoroeth, midazolam , sodium chloride  flush, ropivacaine  (PF) 2 mg/mL (0.2%), and triamcinolone  acetonide.  See the medical record for exact dosing, route, and time of administration.    Interventional Therapies  Risk Factors  Considerations  Medical Comorbidities:  Lichen planus  BA  HTN  HH  GERD  Anxiety/Depression     Planned  Pending:      Under consideration:   Therapeutic right L4-5 interlaminar LESI #2  Therapeutic right L4-5 TFESI #1  Diagnostic/therapeutic right vs bilateral lumbar facet MBB #1    Completed: (Analgesic benefit)1  Therapeutic right L4-5 interlaminar LESI x1 (03/25/2024)    Therapeutic  Palliative (PRN) options:   None established   Completed by other providers:   None reported  1(Analgesic benefit): Expressed in percentage (%). (Local anesthetic[LA] +/- sedation  L.A.Local Anesthetic  Steroid benefit  Ongoing benefit)      Follow-up plan:   Return in about 2 weeks (around 04/08/2024) for (Face2F), (PPE).     Recent Visits Date Type Provider Dept  02/25/24 Office Visit Tanya Glisson, MD [facility]  01/28/24 Office Visit Tanya Glisson, MD [facility]  Showing recent visits within past 90 days and meeting all other requirements Today's Visits Date Type Provider Dept  03/25/24 Procedure visit Tanya Glisson, MD [facility]  Showing today's visits and meeting all other requirements Future Appointments No visits were found meeting these conditions. Showing future appointments within next 90 days and meeting all other requirements   Disposition: Discharge home  Discharge (Date  Time): 03/25/2024;   hrs.   Primary Care Physician: [facility] Location[facility] Note by: Glisson DELENA Tanya, MD  (TTS technology used. I apologize for any typographical errors that were not detected and corrected.) Date: 03/25/2024; Time: 10:20 AM  Disclaimer:  Medicine is not an visual merchandiser. The only guarantee in medicine is that nothing is guaranteed. It is important to note that the decision to proceed with this intervention was based on the information collected from the patient. The Data and conclusions were drawn from the patient's questionnaire, the interview, and the physical examination. Because the information was provided in large part by the patient, it cannot be guaranteed that it has not been purposely or unconsciously manipulated. Every effort has been made to obtain as much relevant data as possible for this evaluation. It is important to note that the conclusions that lead to this procedure are derived in large part from the available data. Always take into account that the treatment will also be dependent on availability of resources and existing treatment guidelines, considered by other Pain Management Practitioners as being common knowledge and practice, at the time of the intervention. For Medico-Legal purposes, it is also important to point out that variation in procedural techniques and pharmacological choices are the acceptable norm. The indications, contraindications, technique, and results of the above procedure should only be interpreted and judged by a Board-Certified Interventional Pain Specialist with extensive familiarity and expertise in the same exact procedure and technique.

## 2024-03-25 NOTE — Patient Instructions (Signed)
 ______________________________________________________________________    Post-Procedure Discharge Instructions  INSTRUCTIONS Apply ice:  Purpose: This will minimize any swelling and discomfort after procedure.  When: Day of procedure, as soon as you get home. How: Fill a plastic sandwich bag with crushed ice. Cover it with a small towel and apply to injection site. How long: (15 min on, 15 min off) Apply for 15 minutes then remove x 15 minutes.  Repeat sequence on day of procedure, until you go to bed. Apply heat:  Purpose: To treat any soreness and discomfort from the procedure. When: Starting the next day after the procedure. How: Apply heat to procedure site starting the day following the procedure. How long: May continue to repeat daily, until discomfort goes away. Food intake: Start with clear liquids (like water) and advance to regular food, as tolerated.  Physical activities: Keep activities to a minimum for the first 8 hours after the procedure. After that, then as tolerated. Driving: If you have received any sedation, be responsible and do not drive. You are not allowed to drive for 24 hours after having sedation. Blood thinner: (Applies only to those taking blood thinners) You may restart your blood thinner 6 hours after your procedure. Insulin: (Applies only to Diabetic patients taking insulin) As soon as you can eat, you may resume your normal dosing schedule. Infection prevention: Keep procedure site clean and dry. Shower daily and clean area with soap and water.  PAIN DIARY Post-procedure Pain Diary: Extremely important that this be done correctly and accurately. Recorded information will be used to determine the next step in treatment. For the purpose of accuracy, follow these rules: Evaluate only the area treated. Do not report or include pain from an untreated area. For the purpose of this evaluation, ignore all other areas of pain, except for the treated area. After your  procedure, avoid taking a long nap and attempting to complete the pain diary after you wake up. Instead, set your alarm clock to go off every hour, on the hour, for the initial 8 hours after the procedure. Document the duration of the numbing medicine, and the relief you are getting from it. Do not go to sleep and attempt to complete it later. It will not be accurate. If you received sedation, it is likely that you were given a medication that may cause amnesia. Because of this, completing the diary at a later time may cause the information to be inaccurate. This information is needed to plan your care. Follow-up appointment: Keep your post-procedure follow-up evaluation appointment after the procedure (usually 2 weeks for most procedures, 6 weeks for radiofrequencies). DO NOT FORGET to bring you pain diary with you.   EXPECT... (What should I expect to see with my procedure?) From numbing medicine (AKA: Local Anesthetics): Numbness or decrease in pain. You may also experience some weakness, which if present, could last for the duration of the local anesthetic. Onset: Full effect within 15 minutes of injected. Duration: It will depend on the type of local anesthetic used. On the average, 1 to 8 hours.  From steroids (Applies only if steroids were used): Decrease in swelling or inflammation. Once inflammation is improved, relief of the pain will follow. Onset of benefits: Depends on the amount of swelling present. The more swelling, the longer it will take for the benefits to be seen. In some cases, up to 10 days. Duration: Steroids will stay in the system x 2 weeks. Duration of benefits will depend on multiple posibilities including persistent irritating  factors. Side-effects: If present, they may typically last 2 weeks (the duration of the steroids). Frequent: Cramps (if they occur, drink Gatorade and take over-the-counter Magnesium 450-500 mg once to twice a day); water retention with temporary weight  gain; increases in blood sugar; decreased immune system response; increased appetite. Occasional: Facial flushing (red, warm cheeks); mood swings; menstrual changes. Uncommon: Long-term decrease or suppression of natural hormones; bone thinning. (These are more common with higher doses or more frequent use. This is why we prefer that our patients avoid having any injection therapies in other practices.)  Very Rare: Severe mood changes; psychosis; aseptic necrosis. From procedure: Some discomfort is to be expected once the numbing medicine wears off. This should be minimal if ice and heat are applied as instructed.  CALL IF... (When should I call?) You experience numbness and weakness that gets worse with time, as opposed to wearing off. New onset bowel or bladder incontinence. (Applies only to procedures done in the spine)  Emergency Numbers: Durning business hours (Monday - Thursday, 8:00 AM - 4:00 PM) (Friday, 9:00 AM - 12:00 Noon): [phone] After hours: [phone] NOTE: If you are having a problem and are unable connect with, or to talk to a provider, then go to your nearest urgent care or emergency department. If the problem is serious and urgent, please call 911. ______________________________________________________________________     ______________________________________________________________________    Steroid injections  Common steroids for injections Triamcinolone : Used by many sports medicine physicians for large joint and bursal injections, often combined with a local anesthetic like lidocaine . A study focusing on coccydynia (tailbone pain) found triamcinolone  was more effective than betamethasone, suggesting it may also be preferable for other localized inflammation conditions. Methylprednisolone: A common alternative to triamcinolone  that is also a strong anti-inflammatory. It is available in different formulations, with the acetate suspension being the long-acting  option for intra-articular injections. Dexamethasone : This is a non-particulate steroid, meaning it has a lower risk of tissue damage compared to particulate steroids like triamcinolone  and methylprednisolone. While less common for this specific use, it is an option for targeted injections.   Considerations for physicians Particulate vs. non-particulate steroids: Triamcinolone  and methylprednisolone are particulate, meaning they can clump together. Dexamethasone  is non-particulate. Particulate steroids are often preferred for their longer-lasting effects but carry a theoretical higher risk for certain injections (though this is less of a concern in the costochondral joints). Combined injectate: Corticosteroids are typically mixed with a local anesthetic like lidocaine  to provide both immediate pain relief (from the anesthetic) and longer-term inflammation reduction (from the steroid). Imaging guidance: To ensure accurate placement of the needle and medication, physicians may use ultrasound or fluoroscopic guidance for the injection, especially in complex or refractory cases.   Patient guidance Before undergoing a steroid injection, discuss the options with your physician. They will determine the best steroid, dosage, and procedure for your specific case based on factors like: Severity of your condition History of response to other treatments Your overall health status Experience and preference of the physician  Last  Updated: 10/30/2023 ______________________________________________________________________

## 2024-03-26 ENCOUNTER — Telehealth: Payer: Self-pay

## 2024-03-26 NOTE — Telephone Encounter (Signed)
 No issues post-procedure.

## 2024-04-16 ENCOUNTER — Ambulatory Visit: Admitting: Pain Medicine
